# Patient Record
Sex: Female | Born: 1995
Health system: Southern US, Community
[De-identification: ages and names within clinical notes are randomized; demographics above are authoritative.]

## PROBLEM LIST (undated history)

## (undated) DIAGNOSIS — R479 Unspecified speech disturbances: Secondary | ICD-10-CM

## (undated) DIAGNOSIS — K5900 Constipation, unspecified: Secondary | ICD-10-CM

## (undated) DIAGNOSIS — F988 Other specified behavioral and emotional disorders with onset usually occurring in childhood and adolescence: Secondary | ICD-10-CM

## (undated) DIAGNOSIS — S82842A Displaced bimalleolar fracture of left lower leg, initial encounter for closed fracture: Secondary | ICD-10-CM

## (undated) DIAGNOSIS — S92902A Unspecified fracture of left foot, initial encounter for closed fracture: Secondary | ICD-10-CM

## (undated) DIAGNOSIS — G40219 Localization-related (focal) (partial) symptomatic epilepsy and epileptic syndromes with complex partial seizures, intractable, without status epilepticus: Secondary | ICD-10-CM

## (undated) DIAGNOSIS — G9349 Other encephalopathy: Secondary | ICD-10-CM

## (undated) HISTORY — DX: Displaced bimalleolar fracture of left lower leg, initial encounter for closed fracture: S82.842A

## (undated) HISTORY — DX: Other specified behavioral and emotional disorders with onset usually occurring in childhood and adolescence: F98.8

## (undated) HISTORY — DX: Localization-related (focal) (partial) symptomatic epilepsy and epileptic syndromes with complex partial seizures, intractable, without status epilepticus: G40.219

## (undated) HISTORY — DX: Other encephalopathy: G93.49

## (undated) HISTORY — DX: Unspecified fracture of left foot, initial encounter for closed fracture: S92.902A

---
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
## 2017-01-09 DIAGNOSIS — F79 Unspecified intellectual disabilities: Secondary | ICD-10-CM | POA: Diagnosis not present

## 2017-01-09 DIAGNOSIS — N3946 Mixed incontinence: Secondary | ICD-10-CM | POA: Diagnosis not present

## 2017-01-16 ENCOUNTER — Other Ambulatory Visit: Payer: Self-pay | Admitting: Family Medicine

## 2017-02-07 DIAGNOSIS — F79 Unspecified intellectual disabilities: Secondary | ICD-10-CM | POA: Diagnosis not present

## 2017-02-07 DIAGNOSIS — N3946 Mixed incontinence: Secondary | ICD-10-CM | POA: Diagnosis not present

## 2017-03-09 DIAGNOSIS — F79 Unspecified intellectual disabilities: Secondary | ICD-10-CM | POA: Diagnosis not present

## 2017-03-09 DIAGNOSIS — N3946 Mixed incontinence: Secondary | ICD-10-CM | POA: Diagnosis not present

## 2017-03-22 ENCOUNTER — Other Ambulatory Visit: Payer: Self-pay | Admitting: Family Medicine

## 2017-04-03 DIAGNOSIS — G40219 Localization-related (focal) (partial) symptomatic epilepsy and epileptic syndromes with complex partial seizures, intractable, without status epilepticus: Secondary | ICD-10-CM | POA: Diagnosis not present

## 2017-04-06 ENCOUNTER — Other Ambulatory Visit: Payer: BLUE CROSS/BLUE SHIELD

## 2017-04-09 DIAGNOSIS — F79 Unspecified intellectual disabilities: Secondary | ICD-10-CM | POA: Diagnosis not present

## 2017-04-09 DIAGNOSIS — N3946 Mixed incontinence: Secondary | ICD-10-CM | POA: Diagnosis not present

## 2017-04-13 ENCOUNTER — Other Ambulatory Visit: Payer: BLUE CROSS/BLUE SHIELD

## 2017-04-13 DIAGNOSIS — G40219 Localization-related (focal) (partial) symptomatic epilepsy and epileptic syndromes with complex partial seizures, intractable, without status epilepticus: Secondary | ICD-10-CM | POA: Diagnosis not present

## 2017-04-13 DIAGNOSIS — Z79899 Other long term (current) drug therapy: Secondary | ICD-10-CM | POA: Diagnosis not present

## 2017-04-13 DIAGNOSIS — G40209 Localization-related (focal) (partial) symptomatic epilepsy and epileptic syndromes with complex partial seizures, not intractable, without status epilepticus: Secondary | ICD-10-CM

## 2017-04-16 ENCOUNTER — Telehealth: Payer: Self-pay | Admitting: Family Medicine

## 2017-04-16 NOTE — Telephone Encounter (Signed)
These were drawn for her neurologist Dr. Claudie LeachLihn Ngo know that her total carbamazepine level was slightly hight at 12.9. We are still waiting on the free carbamazepine and lamotrigine to come back. Her CBC was normal. I will fax a copy of those labs when they come back. Thanks!

## 2017-04-16 NOTE — Telephone Encounter (Signed)
-----   Message from Particia Nearingachel Elizabeth Lane, [place] sent at 04/16/2017  9:31 AM EDT -----   ----- Message ----- From: Nell RangeInterface, [facility] Lab Results In Sent: 04/14/2017   7:41 AM To: Particia Nearingachel Elizabeth Lane, PA-C

## 2017-04-16 NOTE — Telephone Encounter (Signed)
Noted. Will send full results when they get back.

## 2017-04-16 NOTE — Telephone Encounter (Signed)
Called neuro, the NP is out of the office today, so I had to leave a detailed message on the administrative assistants machine letting her know the results.

## 2017-04-18 LAB — CBC WITH DIFFERENTIAL/PLATELET
Basophils Absolute: 0 10*3/uL (ref 0.0–0.2)
Basos: 0 %
EOS (ABSOLUTE): 0.1 10*3/uL (ref 0.0–0.4)
EOS: 2 %
HEMATOCRIT: 38.3 % (ref 34.0–46.6)
HEMOGLOBIN: 13.3 g/dL (ref 11.1–15.9)
Immature Grans (Abs): 0 10*3/uL (ref 0.0–0.1)
Immature Granulocytes: 0 %
LYMPHS ABS: 2.6 10*3/uL (ref 0.7–3.1)
Lymphs: 41 %
MCH: 30.7 pg (ref 26.6–33.0)
MCHC: 34.7 g/dL (ref 31.5–35.7)
MCV: 89 fL (ref 79–97)
Monocytes Absolute: 0.3 10*3/uL (ref 0.1–0.9)
Monocytes: 5 %
NEUTROS ABS: 3.3 10*3/uL (ref 1.4–7.0)
Neutrophils: 52 %
Platelets: 329 10*3/uL (ref 150–379)
RBC: 4.33 x10E6/uL (ref 3.77–5.28)
RDW: 13.5 % (ref 12.3–15.4)
WBC: 6.3 10*3/uL (ref 3.4–10.8)

## 2017-04-18 LAB — LAMOTRIGINE LEVEL: Lamotrigine Lvl: 12.1 ug/mL (ref 2.0–20.0)

## 2017-04-18 LAB — CARBAMAZEPINE, FREE AND TOTAL
Carbamazepine, Free: 2.4 ug/mL (ref 0.6–4.2)
Carbamazepine, Total: 12.9 ug/mL (ref 4.0–12.0)

## 2017-04-18 NOTE — Telephone Encounter (Addendum)
Labs [facility] and [facility] to [facility]

## 2017-05-04 ENCOUNTER — Other Ambulatory Visit: Payer: Self-pay | Admitting: Family Medicine

## 2017-05-09 DIAGNOSIS — F79 Unspecified intellectual disabilities: Secondary | ICD-10-CM | POA: Diagnosis not present

## 2017-05-09 DIAGNOSIS — N3946 Mixed incontinence: Secondary | ICD-10-CM | POA: Diagnosis not present

## 2017-06-12 ENCOUNTER — Ambulatory Visit
Admission: RE | Admit: 2017-06-12 | Discharge: 2017-06-12 | Disposition: A | Discharge status: Home / Self Care | Payer: BLUE CROSS/BLUE SHIELD | Source: Ambulatory Visit | Attending: Family Medicine | Admitting: Family Medicine

## 2017-06-12 ENCOUNTER — Encounter: Payer: Self-pay | Admitting: Family Medicine

## 2017-06-12 ENCOUNTER — Telehealth: Payer: Self-pay

## 2017-06-12 ENCOUNTER — Ambulatory Visit (INDEPENDENT_AMBULATORY_CARE_PROVIDER_SITE_OTHER): Payer: BLUE CROSS/BLUE SHIELD | Admitting: Family Medicine

## 2017-06-12 VITALS — BP 102/74 | HR 122

## 2017-06-12 DIAGNOSIS — M25572 Pain in left ankle and joints of left foot: Secondary | ICD-10-CM | POA: Diagnosis not present

## 2017-06-12 DIAGNOSIS — G40219 Localization-related (focal) (partial) symptomatic epilepsy and epileptic syndromes with complex partial seizures, intractable, without status epilepticus: Secondary | ICD-10-CM | POA: Diagnosis not present

## 2017-06-12 DIAGNOSIS — S82852A Displaced trimalleolar fracture of left lower leg, initial encounter for closed fracture: Secondary | ICD-10-CM | POA: Insufficient documentation

## 2017-06-12 DIAGNOSIS — W19XXXA Unspecified fall, initial encounter: Secondary | ICD-10-CM | POA: Insufficient documentation

## 2017-06-12 DIAGNOSIS — N3946 Mixed incontinence: Secondary | ICD-10-CM | POA: Diagnosis not present

## 2017-06-12 DIAGNOSIS — F79 Unspecified intellectual disabilities: Secondary | ICD-10-CM | POA: Diagnosis not present

## 2017-06-12 NOTE — Telephone Encounter (Signed)
Advised group home that referral is scheduled for ankle fracture today at [facility] with Dr. Clent RidgesKransinksi at 2pm. Also reminded them to call her neurologist and let them know she had another seizure and to have a follow up appt for that.

## 2017-06-12 NOTE — Progress Notes (Signed)
   BP 102/74   Pulse (!) 122   SpO2 96%    Subjective:    Patient ID: Marcia BrashLauren Livolsi, female    DOB: October 09, 1996, 21 y.o.   MRN: [id number]  HPI: Marcia BrashLauren Escalera is a 21 y.o. female  Chief Complaint  Patient presents with  . Foot Injury    she fell Saturday and injured her left foot, swollen, painful, she won't put pressure on it.    Patient presents with caregivers today (who provide the entire history) as they noticed her left foot was significantly swollen, bruised, and painful per pt. They state she refuses to bear weight on it. Suspected to have come from an unwitnessed seizure Saturday. Has not been given tylenol, denies use of ice, elevation, walking supports, compression at this time. Caregivers state this issue was not noted until this morning.   Relevant past medical, surgical, family and social history reviewed and updated as indicated. Interim medical history since our last visit reviewed. Allergies and medications reviewed and updated.  Review of Systems  Constitutional: Negative.   Respiratory: Negative.   Cardiovascular: Negative.   Gastrointestinal: Negative.   Musculoskeletal: Positive for arthralgias and joint swelling.  Neurological: Negative.   Psychiatric/Behavioral: Negative.    Per HPI unless specifically indicated above     Objective:    BP 102/74   Pulse (!) 122   SpO2 96%   Wt Readings from Last 3 Encounters:  12/13/16 166 lb (75.3 kg) (91 %, Z= 1.33)*  12/01/16 164 lb (74.4 kg) (90 %, Z= 1.28)*  11/15/16 164 lb (74.4 kg) (90 %, Z= 1.29)*   * Growth percentiles are based on CDC 2-20 Years data.    Physical Exam  Constitutional: She appears well-developed and well-nourished. No distress.  HENT:  Head: Atraumatic.  Eyes: Pupils are equal, round, and reactive to light. Conjunctivae are normal.  Neck: Normal range of motion. Neck supple.  Cardiovascular: Normal rate.   Pulmonary/Chest: Effort normal. No respiratory distress.  Musculoskeletal:  She exhibits edema (significant edema left ankle down into entire foot) and tenderness (left ankle).  Limited exam today given pt discomfort at left ankle, foot  Neurological: She is alert.  Skin: Skin is warm and dry.  Significant bruising at left ankle  Psychiatric: She has a normal mood and affect. Her behavior is normal.  Nursing note and vitals reviewed.     Assessment & Plan:   Problem List Items Addressed This Visit      Nervous and Auditory   Partial epilepsy with impairment of consciousness, intractable (HCC)    Continue current regimen, f/u as soon as possible with Neurologist to discuss continued seizure episodes       Other Visit Diagnoses    Acute left ankle pain    -  Primary   Will get left ankle x-ray, suspect fx given presentation and non-weight bearing status. RICE protocol until results come in   Relevant Orders   DG Ankle Complete Left (Completed)       Follow up plan: Return if symptoms worsen or fail to improve.

## 2017-06-13 DIAGNOSIS — S82855A Nondisplaced trimalleolar fracture of left lower leg, initial encounter for closed fracture: Secondary | ICD-10-CM | POA: Diagnosis not present

## 2017-06-14 NOTE — Assessment & Plan Note (Signed)
Continue current regimen, f/u as soon as possible with Neurologist to discuss continued seizure episodes

## 2017-06-14 NOTE — Patient Instructions (Signed)
Follow up with Neurology.

## 2017-06-15 DIAGNOSIS — S8292XA Unspecified fracture of left lower leg, initial encounter for closed fracture: Secondary | ICD-10-CM | POA: Diagnosis not present

## 2017-06-28 DIAGNOSIS — S82855D Nondisplaced trimalleolar fracture of left lower leg, subsequent encounter for closed fracture with routine healing: Secondary | ICD-10-CM | POA: Diagnosis not present

## 2017-07-10 DIAGNOSIS — N3946 Mixed incontinence: Secondary | ICD-10-CM | POA: Diagnosis not present

## 2017-07-10 DIAGNOSIS — F79 Unspecified intellectual disabilities: Secondary | ICD-10-CM | POA: Diagnosis not present

## 2017-07-26 DIAGNOSIS — S82855D Nondisplaced trimalleolar fracture of left lower leg, subsequent encounter for closed fracture with routine healing: Secondary | ICD-10-CM | POA: Diagnosis not present

## 2017-08-06 DIAGNOSIS — M6281 Muscle weakness (generalized): Secondary | ICD-10-CM | POA: Diagnosis not present

## 2017-08-06 DIAGNOSIS — R262 Difficulty in walking, not elsewhere classified: Secondary | ICD-10-CM | POA: Diagnosis not present

## 2017-08-08 DIAGNOSIS — R262 Difficulty in walking, not elsewhere classified: Secondary | ICD-10-CM | POA: Diagnosis not present

## 2017-08-08 DIAGNOSIS — M6281 Muscle weakness (generalized): Secondary | ICD-10-CM | POA: Diagnosis not present

## 2017-08-10 DIAGNOSIS — F79 Unspecified intellectual disabilities: Secondary | ICD-10-CM | POA: Diagnosis not present

## 2017-08-10 DIAGNOSIS — N3946 Mixed incontinence: Secondary | ICD-10-CM | POA: Diagnosis not present

## 2017-08-15 ENCOUNTER — Encounter: Payer: Self-pay | Admitting: Family Medicine

## 2017-08-15 ENCOUNTER — Ambulatory Visit: Admission: RE | Admit: 2017-08-15 | Payer: BLUE CROSS/BLUE SHIELD | Source: Ambulatory Visit | Admitting: *Deleted

## 2017-08-15 ENCOUNTER — Ambulatory Visit
Admission: RE | Admit: 2017-08-15 | Discharge: 2017-08-15 | Disposition: A | Discharge status: Home / Self Care | Payer: BLUE CROSS/BLUE SHIELD | Source: Ambulatory Visit | Attending: Family Medicine | Admitting: Family Medicine

## 2017-08-15 ENCOUNTER — Ambulatory Visit (INDEPENDENT_AMBULATORY_CARE_PROVIDER_SITE_OTHER): Payer: BLUE CROSS/BLUE SHIELD | Admitting: Family Medicine

## 2017-08-15 VITALS — BP 93/65 | HR 97 | Temp 99.2°F

## 2017-08-15 DIAGNOSIS — M419 Scoliosis, unspecified: Secondary | ICD-10-CM | POA: Diagnosis not present

## 2017-08-15 DIAGNOSIS — M545 Low back pain, unspecified: Secondary | ICD-10-CM

## 2017-08-15 DIAGNOSIS — R42 Dizziness and giddiness: Secondary | ICD-10-CM

## 2017-08-15 LAB — UA/M W/RFLX CULTURE, ROUTINE
BILIRUBIN UA: NEGATIVE
GLUCOSE, UA: NEGATIVE
KETONES UA: NEGATIVE
Leukocytes, UA: NEGATIVE
NITRITE UA: NEGATIVE
Protein, UA: NEGATIVE
RBC UA: NEGATIVE
SPEC GRAV UA: 1.02 (ref 1.005–1.030)
UUROB: 0.2 mg/dL (ref 0.2–1.0)
pH, UA: 6.5 (ref 5.0–7.5)

## 2017-08-15 NOTE — Progress Notes (Signed)
BP 93/65 (BP Location: Left Arm, Patient Position: Sitting, Cuff Size: Normal)   Pulse 97   Temp 99.2 F (37.3 C) (Tympanic)   SpO2 97%    Subjective:    Patient ID: Angelica Stevenson, female    DOB: 01-Mar-1996, 21 y.o.   MRN: [id number]  HPI: Angelica Stevenson is a 21 y.o. female  Chief Complaint  Patient presents with  . Back Pain    Pt reports back pain and dizziness. Caregiver states she could have had a seizure last night.  . Dizziness   Genita presents today with a couple hour history of low back pain. Her caregivers think that she may have had an unwittnessed seaizure last night, but they aren't sure. She started complaining of low back pain at breakfast. No known injuries. Pain is aching and sore in and in the lumbar region. They haven't tried anything for it. Nothing seems to make it worse. No radiation. She is otherwise feeling well. Her urine seems to be fine. She notes that she has also been having some dizziness. She is otherwise feeling well with no other concerns. She does have a history of a fracture of her foot with minimal trauma.   Relevant past medical, surgical, family and social history reviewed and updated as indicated. Interim medical history since our last visit reviewed. Allergies and medications reviewed and updated.  Review of Systems  Constitutional: Negative.   Respiratory: Negative.   Cardiovascular: Negative.   Musculoskeletal: Positive for back pain and myalgias. Negative for arthralgias, gait problem, joint swelling, neck pain and neck stiffness.  Neurological: Positive for dizziness. Negative for tremors, seizures, syncope, facial asymmetry, speech difficulty, weakness, light-headedness, numbness and headaches.  Psychiatric/Behavioral: Negative.     Per HPI unless specifically indicated above     Objective:    BP 93/65 (BP Location: Left Arm, Patient Position: Sitting, Cuff Size: Normal)   Pulse 97   Temp 99.2 F (37.3 C) (Tympanic)   SpO2  97%   Wt Readings from Last 3 Encounters:  12/13/16 166 lb (75.3 kg) (91 %, Z= 1.33)*  12/01/16 164 lb (74.4 kg) (90 %, Z= 1.28)*  11/15/16 164 lb (74.4 kg) (90 %, Z= 1.29)*   * Growth percentiles are based on CDC (Girls, 2-20 Years) data.    Physical Exam  Constitutional: She is oriented to person, place, and time. She appears well-developed and well-nourished. No distress.  HENT:  Head: Normocephalic and atraumatic.  Right Ear: Hearing normal.  Left Ear: Hearing normal.  Nose: Nose normal.  Eyes: Conjunctivae and lids are normal. Right eye exhibits no discharge. Left eye exhibits no discharge. No scleral icterus.  Cardiovascular: Normal rate, regular rhythm, normal heart sounds and intact distal pulses. Exam reveals no gallop and no friction rub.  No murmur heard. Pulmonary/Chest: Effort normal and breath sounds normal. No respiratory distress. She has no wheezes. She has no rales. She exhibits no tenderness.  Neurological: She is alert and oriented to person, place, and time.  Skin: Skin is warm and intact. No rash noted. She is not diaphoretic. No erythema. No pallor.  Psychiatric: She has a normal mood and affect. Her speech is normal and behavior is normal. Judgment and thought content normal. Cognition and memory are normal.  Nursing note and vitals reviewed. Back Exam:    Inspection:  Normal spinal curvature.  No deformity, ecchymosis, erythema, or lesions     Palpation:     Midline spinal tenderness: yes lumbar  Paralumbar tenderness: yes bilateral     Parathoracic tenderness: no      Buttocks tenderness: no     Range of Motion:      Flexion: Normal     Extension:Normal     Lateral bending:Normal    Rotation:Normal    Neuro Exam:Lower extremity DTRs normal & symmetric.  Strength and sensation intact.    Special Tests:      Straight leg raise:negative  Results for orders placed or performed in visit on 04/13/17  Lamotrigine level  Result Value Ref Range    Lamotrigine Lvl 12.1 2.0 - 20.0 ug/mL  Carbamazepine, Free and Total  Result Value Ref Range   Carbamazepine, Total 12.9 (HH) 4.0 - 12.0 ug/mL   Carbamazepine, Free 2.4 0.6 - 4.2 ug/mL  CBC with Differential/Platelet  Result Value Ref Range   WBC 6.3 3.4 - 10.8 x10E3/uL   RBC 4.33 3.77 - 5.28 x10E6/uL   Hemoglobin 13.3 11.1 - 15.9 g/dL   Hematocrit 16.138.3 09.634.0 - 46.6 %   MCV 89 79 - 97 fL   MCH 30.7 26.6 - 33.0 pg   MCHC 34.7 31.5 - 35.7 g/dL   RDW 04.513.5 40.912.3 - 81.115.4 %   Platelets 329 150 - 379 x10E3/uL   Neutrophils 52 Not Estab. %   Lymphs 41 Not Estab. %   Monocytes 5 Not Estab. %   Eos 2 Not Estab. %   Basos 0 Not Estab. %   Neutrophils Absolute 3.3 1.4 - 7.0 x10E3/uL   Lymphocytes Absolute 2.6 0.7 - 3.1 x10E3/uL   Monocytes Absolute 0.3 0.1 - 0.9 x10E3/uL   EOS (ABSOLUTE) 0.1 0.0 - 0.4 x10E3/uL   Basophils Absolute 0.0 0.0 - 0.2 x10E3/uL   Immature Granulocytes 0 Not Estab. %   Immature Grans (Abs) 0.0 0.0 - 0.1 x10E3/uL      Assessment & Plan:   Problem List Items Addressed This Visit    None    Visit Diagnoses    Acute bilateral low back pain without sciatica    -  Primary   Likely muscle spasm. Will obtain x-ray given history of fracture with minimal trauma. Await results. Treat with PRN tylenol and heat. Let us know if not better.   Relevant Orders   UA/M w/rflx Culture, Routine   DG Lumbar Spine Complete   Dizziness       Not with siting to standing. Possible post-ictal. If not better in a day or 2, let us know.        Follow up plan: Return Pending results. .Marland Kitchen

## 2017-08-16 ENCOUNTER — Telehealth: Payer: Self-pay | Admitting: Family Medicine

## 2017-08-16 NOTE — Telephone Encounter (Signed)
Please let them know that her x-ray came back normal. Thanks!

## 2017-08-16 NOTE — Telephone Encounter (Signed)
Called and left a voicemail on home number letting parents know that the xray was normal.

## 2017-08-20 DIAGNOSIS — M6281 Muscle weakness (generalized): Secondary | ICD-10-CM | POA: Diagnosis not present

## 2017-08-20 DIAGNOSIS — R262 Difficulty in walking, not elsewhere classified: Secondary | ICD-10-CM | POA: Diagnosis not present

## 2017-08-27 DIAGNOSIS — M6281 Muscle weakness (generalized): Secondary | ICD-10-CM | POA: Diagnosis not present

## 2017-08-27 DIAGNOSIS — R262 Difficulty in walking, not elsewhere classified: Secondary | ICD-10-CM | POA: Diagnosis not present

## 2017-08-27 DIAGNOSIS — S82855D Nondisplaced trimalleolar fracture of left lower leg, subsequent encounter for closed fracture with routine healing: Secondary | ICD-10-CM | POA: Diagnosis not present

## 2017-09-03 DIAGNOSIS — M6281 Muscle weakness (generalized): Secondary | ICD-10-CM | POA: Diagnosis not present

## 2017-09-03 DIAGNOSIS — R262 Difficulty in walking, not elsewhere classified: Secondary | ICD-10-CM | POA: Diagnosis not present

## 2017-09-10 DIAGNOSIS — S82855D Nondisplaced trimalleolar fracture of left lower leg, subsequent encounter for closed fracture with routine healing: Secondary | ICD-10-CM | POA: Diagnosis not present

## 2017-09-11 DIAGNOSIS — F79 Unspecified intellectual disabilities: Secondary | ICD-10-CM | POA: Diagnosis not present

## 2017-09-11 DIAGNOSIS — N3946 Mixed incontinence: Secondary | ICD-10-CM | POA: Diagnosis not present

## 2017-09-20 DIAGNOSIS — S82842A Displaced bimalleolar fracture of left lower leg, initial encounter for closed fracture: Secondary | ICD-10-CM | POA: Diagnosis not present

## 2017-09-21 DIAGNOSIS — M6281 Muscle weakness (generalized): Secondary | ICD-10-CM | POA: Diagnosis not present

## 2017-09-21 DIAGNOSIS — R262 Difficulty in walking, not elsewhere classified: Secondary | ICD-10-CM | POA: Diagnosis not present

## 2017-10-10 DIAGNOSIS — F79 Unspecified intellectual disabilities: Secondary | ICD-10-CM | POA: Diagnosis not present

## 2017-10-10 DIAGNOSIS — N3946 Mixed incontinence: Secondary | ICD-10-CM | POA: Diagnosis not present

## 2017-10-11 DIAGNOSIS — S82842A Displaced bimalleolar fracture of left lower leg, initial encounter for closed fracture: Secondary | ICD-10-CM | POA: Diagnosis not present

## 2017-10-15 ENCOUNTER — Other Ambulatory Visit: Payer: Self-pay | Admitting: Orthopedic Surgery

## 2017-10-16 ENCOUNTER — Encounter
Admission: RE | Admit: 2017-10-16 | Discharge: 2017-10-16 | Disposition: A | Discharge status: Home / Self Care | Payer: BLUE CROSS/BLUE SHIELD | Source: Ambulatory Visit | Attending: Orthopedic Surgery | Admitting: Orthopedic Surgery

## 2017-10-16 ENCOUNTER — Telehealth: Payer: Self-pay | Admitting: Family Medicine

## 2017-10-16 ENCOUNTER — Other Ambulatory Visit: Payer: Self-pay

## 2017-10-16 ENCOUNTER — Encounter: Payer: Self-pay | Admitting: Family Medicine

## 2017-10-16 ENCOUNTER — Ambulatory Visit (INDEPENDENT_AMBULATORY_CARE_PROVIDER_SITE_OTHER): Payer: BLUE CROSS/BLUE SHIELD | Admitting: Family Medicine

## 2017-10-16 VITALS — BP 106/73 | HR 100 | Temp 97.9°F

## 2017-10-16 DIAGNOSIS — S82842A Displaced bimalleolar fracture of left lower leg, initial encounter for closed fracture: Secondary | ICD-10-CM | POA: Diagnosis not present

## 2017-10-16 DIAGNOSIS — Z79899 Other long term (current) drug therapy: Secondary | ICD-10-CM | POA: Diagnosis not present

## 2017-10-16 DIAGNOSIS — G40219 Localization-related (focal) (partial) symptomatic epilepsy and epileptic syndromes with complex partial seizures, intractable, without status epilepticus: Secondary | ICD-10-CM

## 2017-10-16 DIAGNOSIS — Z887 Allergy status to serum and vaccine status: Secondary | ICD-10-CM | POA: Diagnosis not present

## 2017-10-16 DIAGNOSIS — G9349 Other encephalopathy: Secondary | ICD-10-CM | POA: Diagnosis not present

## 2017-10-16 DIAGNOSIS — Z01818 Encounter for other preprocedural examination: Secondary | ICD-10-CM | POA: Diagnosis not present

## 2017-10-16 DIAGNOSIS — S92902D Unspecified fracture of left foot, subsequent encounter for fracture with routine healing: Secondary | ICD-10-CM

## 2017-10-16 DIAGNOSIS — K5900 Constipation, unspecified: Secondary | ICD-10-CM | POA: Diagnosis not present

## 2017-10-16 DIAGNOSIS — F988 Other specified behavioral and emotional disorders with onset usually occurring in childhood and adolescence: Secondary | ICD-10-CM | POA: Diagnosis not present

## 2017-10-16 DIAGNOSIS — Y939 Activity, unspecified: Secondary | ICD-10-CM | POA: Diagnosis not present

## 2017-10-16 DIAGNOSIS — G40802 Other epilepsy, not intractable, without status epilepticus: Secondary | ICD-10-CM | POA: Diagnosis not present

## 2017-10-16 DIAGNOSIS — W19XXXA Unspecified fall, initial encounter: Secondary | ICD-10-CM | POA: Diagnosis not present

## 2017-10-16 DIAGNOSIS — R479 Unspecified speech disturbances: Secondary | ICD-10-CM | POA: Diagnosis not present

## 2017-10-16 HISTORY — DX: Unspecified speech disturbances: R47.9

## 2017-10-16 HISTORY — DX: Constipation, unspecified: K59.00

## 2017-10-16 NOTE — Assessment & Plan Note (Signed)
To have surgery on 10/18/17.

## 2017-10-16 NOTE — Telephone Encounter (Signed)
Copied from CRM [phone]. Topic: Inquiry >> Oct 16, 2017  9:33 AM Viviann SpareWhite, Selina wrote: Reason for CRM: Dennard NipCheri from [facility] [phone] called to check on the status of the surgery clearance form that was faxed over on 10/11/17 to the attention of Dr. Laural BenesJohnson. The patient is having surgery on 10/18/17 for a left ankle fracture. Dennard NipCheri is re-faxing the form today.

## 2017-10-16 NOTE — Progress Notes (Signed)
BP 106/73 (BP Location: Left Arm, Patient Position: Sitting, Cuff Size: Normal)   Pulse 100   Temp 97.9 F (36.6 C)   SpO2 98%    Subjective:    Patient ID: Angelica Stevenson, female    DOB: 1996-03-18, 22 y.o.   MRN: [id number]  HPI: Angelica Stevenson is a 22 y.o. female  Chief Complaint  Patient presents with  . Surgical Clearance  . FL2   Patient presents today for re-upping her FL2 form for her group home. She is doing well with no concerns or complaints today. She also needs surgical clearance for fixing a L bi-mallleolar ankle fracture. She is scheduled for a open reduction on Thursday 10/18/17. Caregiver was not able to give history. Call made to patient's father in the room. Patient's father states that Angelica Stevenson has never had surgery before. She has had minor sedatives for CTs/MRIs, but has never been fully anesthetized before. She denies any SOB with walking, no history of asthma. No chest pain. No abdominal pain. Generally feeling well, but not able to walk well currently due to her fractures which is why they are pursuing her surgery. Father denies that she has any family history of issues with anesthesia. No family history of post-op nausea or vomiting, no history of malignant hyperthermia. No one has ever had any problems with anesthesia at all. She has a history of epilepsy and both father and caregiver are aware that we can clear Angelica Stevenson pending her results from an internal/family medicine stand point but will not clear her neurologically or make recommendations regarding her seizure medicines. Those will need to come from neurology. They are aware. No other concerns or complaints at this time.    Active Ambulatory Problems    Diagnosis Date Noted  . Partial epilepsy with impairment of consciousness, intractable (HCC)   . Attention deficit disorder (ADD) without hyperactivity   . Static encephalopathy   . Closed fracture of bone of left foot 09/08/2016  . Intermittent explosive  disorder 10/13/2016  . Speech delay 10/13/2016  . Encopresis 10/13/2016  . Dysmenorrhea treated with oral contraceptive 10/13/2016   Resolved Ambulatory Problems    Diagnosis Date Noted  . No Resolved Ambulatory Problems   Past Medical History:  Diagnosis Date  . Attention deficit disorder (ADD) without hyperactivity   . Partial epilepsy with impairment of consciousness, intractable (HCC)   . Static encephalopathy    History reviewed. No pertinent surgical history.  Outpatient Encounter Medications as of 10/16/2017  Medication Sig Note  . acetaminophen (TYLENOL) 160 MG/5ML liquid Take 20 mLs (640 mg total) by mouth every 6 (six) hours as needed for fever or pain. (Patient taking differently: Take 640 mg by mouth every 4 (four) hours as needed for fever or pain (for pain/temp/cold symptoms.). )   . albuterol (PROVENTIL HFA;VENTOLIN HFA) 108 (90 Base) MCG/ACT inhaler Inhale 2 puffs into the lungs every 6 (six) hours as needed for wheezing or shortness of breath.   . carbamazepine (CARBATROL) 200 MG 12 hr capsule Take 200 mg by mouth 2 (two) times daily. (0700 & 1900) 10/12/2017: Total dose=500 mg twice daily  . carbamazepine (CARBATROL) 300 MG 12 hr capsule Take 300 mg by mouth 2 (two) times daily. (0700 &1900) 10/12/2017: Total dose=500 mg twice daily  . guaifenesin (ROBITUSSIN) 100 MG/5ML syrup Take 100 mg by mouth every 4 (four) hours as needed for cough.    . lactulose (CHRONULAC) 10 GM/15ML solution Take 13.3333 g by mouth every evening. (1900)Take 20  mls by mouth every evening   . lamoTRIgine (LAMICTAL) 150 MG tablet Take 300 mg by mouth 2 (two) times daily. (0700 &1900)   . LO LOESTRIN FE 1 MG-10 MCG / 10 MCG tablet TAKE ONE TABLET BY MOUTH EVERY DAY (Patient taking differently: TAKE ONE TABLET BY MOUTH EVERY DAY AT 0700.)   . LORazepam (ATIVAN) 1 MG tablet Take 1.5 mg by mouth every 8 (eight) hours as needed for seizure (after any cluster of seizures, or prolonged seizure,).    .  Magnesium Oxide 250 MG TABS Take 250 mg by mouth daily at 6 (six) AM. (0600)   . midazolam (VERSED) 5 MG/ML injection Place 5 mg into the nose See admin instructions. Place 1 ml (5 mg) in each nostril or on each side mouth between cheek and gum for total of 10 mg for seizure > 5 minutes   . Multiple Vitamin (THEREMS) TABS TAKE ONE TABLET BY MOUTH EVERY EVENING (Patient taking differently: TAKE ONE TABLET BY MOUTH EVERY EVENING AT 1900)   . omega-3 acid ethyl esters (LOVAZA) 1 g capsule Take 1 g by mouth daily. (0700)   . risperiDONE (RISPERDAL) 1 MG tablet Take 1 mg by mouth 2 (two) times daily. (0700 &1900)   . STOOL SOFTENER 100 MG capsule TAKE ONE CAPSULE BY MOUTH EVERY MORNING (Patient taking differently: TAKE ONE CAPSULE BY MOUTH EVERY MORNING AT 0700.)   . [DISCONTINUED] Calcium 200 MG TABS TAKE ONE TABLET BY MOUTH EACH DAY. (Patient taking differently: TAKE ONE TABLET BY MOUTH DAILY AT 0700.)    No facility-administered encounter medications on file as of 10/16/2017.    Allergies  Allergen Reactions  . Influenza Vaccines Other (See Comments)    seizures    Social History   Socioeconomic History  . Marital status: Single    Spouse name: Not on file  . Number of children: Not on file  . Years of education: Not on file  . Highest education level: Not on file  Social Needs  . Financial resource strain: Not on file  . Food insecurity - worry: Not on file  . Food insecurity - inability: Not on file  . Transportation needs - medical: Not on file  . Transportation needs - non-medical: Not on file  Occupational History  . Not on file  Tobacco Use  . Smoking status: Never Smoker  . Smokeless tobacco: Never Used  Substance and Sexual Activity  . Alcohol use: No  . Drug use: No  . Sexual activity: Not on file  Other Topics Concern  . Not on file  Social History Narrative  . Not on file    History reviewed. No pertinent family history.  Review of Systems  Constitutional:  Negative.   Cardiovascular: Negative.   Gastrointestinal: Negative.   Genitourinary: Negative.   Musculoskeletal: Positive for arthralgias and gait problem. Negative for back pain, joint swelling, myalgias, neck pain and neck stiffness.  Skin: Negative.   Neurological: Positive for seizures. Negative for dizziness, tremors, syncope, facial asymmetry, speech difficulty, weakness, light-headedness, numbness and headaches.  Psychiatric/Behavioral: Negative.     Per HPI unless specifically indicated above     Objective:    BP 106/73 (BP Location: Left Arm, Patient Position: Sitting, Cuff Size: Normal)   Pulse 100   Temp 97.9 F (36.6 C)   SpO2 98%   Wt Readings from Last 3 Encounters:  12/13/16 166 lb (75.3 kg) (91 %, Z= 1.33)*  12/01/16 164 lb (74.4 kg) (90 %, Z=  1.28)*  11/15/16 164 lb (74.4 kg) (90 %, Z= 1.29)*   * Growth percentiles are based on CDC (Girls, 2-20 Years) data.    Physical Exam  Constitutional: She is oriented to person, place, and time. She appears well-developed and well-nourished. No distress.  HENT:  Head: Normocephalic and atraumatic.  Right Ear: Hearing normal.  Left Ear: Hearing normal.  Nose: Nose normal.  Eyes: Conjunctivae and lids are normal. Right eye exhibits no discharge. Left eye exhibits no discharge. No scleral icterus.  Cardiovascular: Normal rate, regular rhythm, normal heart sounds and intact distal pulses. Exam reveals no gallop and no friction rub.  No murmur heard. Pulmonary/Chest: Effort normal and breath sounds normal. No respiratory distress. She has no wheezes. She has no rales. She exhibits no tenderness.  Abdominal: Soft. Bowel sounds are normal. She exhibits no distension and no mass. There is no tenderness. There is no rebound and no guarding.  Musculoskeletal: Normal range of motion.  Neurological: She is alert and oriented to person, place, and time.  Skin: Skin is warm, dry and intact. No rash noted. She is not diaphoretic. No  erythema. No pallor.  Psychiatric: She has a normal mood and affect. Her speech is normal and behavior is normal. Cognition and memory are normal.  Nursing note and vitals reviewed.   Results for orders placed or performed in visit on 08/15/17  UA/M w/rflx Culture, Routine  Result Value Ref Range   Specific Gravity, UA 1.020 1.005 - 1.030   pH, UA 6.5 5.0 - 7.5   Color, UA Yellow Yellow   Appearance Ur Clear Clear   Leukocytes, UA Negative Negative   Protein, UA Negative Negative/Trace   Glucose, UA Negative Negative   Ketones, UA Negative Negative   RBC, UA Negative Negative   Bilirubin, UA Negative Negative   Urobilinogen, Ur 0.2 0.2 - 1.0 mg/dL   Nitrite, UA Negative Negative      Assessment & Plan:   Problem List Items Addressed This Visit      Nervous and Auditory   Partial epilepsy with impairment of consciousness, intractable (HCC)    FL2 form filled out today. Stable. Continue to follow with neurology.         Musculoskeletal and Integument   Closed fracture of bone of left foot    To have surgery on 10/18/17.        Other Visit Diagnoses    Preop exam for internal medicine    -  Primary   EKG normal today. Should be cleared from a family medicine perspective pending lab review. Not cleared from neurologic perspective- needs neuro clearance.    Relevant Orders   EKG 12-Lead (Completed)   CBC with Differential/Platelet   Comprehensive metabolic panel   INR/PT   HgB [id number]       Follow up plan: Return As scheduled.

## 2017-10-16 NOTE — Telephone Encounter (Signed)
Patient is being seen today, paperwork will be filled out and faxed when completed.

## 2017-10-16 NOTE — Patient Instructions (Signed)
Your procedure is scheduled on: Thurs. 10/18/17 Report to Day Surgery. To find out your arrival time please call [phone] between 1PM - 3PM on Wed. 10/17/17.  Remember: Instructions that are not followed completely may result in serious medical risk, up to and including death, or upon the discretion of your surgeon and anesthesiologist your surgery may need to be rescheduled.     _X__ 1. Do not eat food after midnight the night before your procedure.                 No gum chewing or hard candies. You may drink clear liquids up to 2 hours                 before you are scheduled to arrive for your surgery- DO not drink clear                 liquids within 2 hours of the start of your surgery.                 Clear Liquids include:  water, apple juice without pulp, clear carbohydrate                 drink such as Clearfast of Gartorade, Black Coffee or Tea (Do not add                 anything to coffee or tea).     __ 2.  No Alcohol for 24 hours before or after surgery.   __ 3.  Do Not Smoke or use e-cigarettes For 24 Hours Prior to Your Surgery.                 Do not use any chewable tobacco products for at least 6 hours prior to                 surgery.  ____  4.  Bring all medications with you on the day of surgery if instructed.   _x___  5.  Notify your doctor if there is any change in your medical condition      (cold, fever, infections).     Do not wear jewelry, make-up, hairpins, clips or nail polish. Do not wear lotions, powders, or perfumes. You may wear deodorant. Do not shave 48 hours prior to surgery. Men may shave face and neck. Do not bring valuables to the hospital.    [facility] any belongings or valuables.  Contacts, dentures or bridgework may not be worn into surgery. Leave your suitcase in the car. After surgery it may be brought to your room. For patients admitted to the hospital, discharge time is determined by  your treatment team.   Patients discharged the day of surgery will not be allowed to drive home.   Please read over the following fact sheets that you were given:    _x___ Take these medicines the morning of surgery with A SIP OF WATER:    1acetaminophen (TYLENOL) 160 MG/5ML liquid.  If needed  2. carbamazepine (CARBATROL) 200 MG 12 hr capsule  3. carbamazepine (CARBATROL) 300 MG 12 hr capsule  4.lamoTRIgine (LAMICTAL) 150 MG tablet  5.LO LOESTRIN FE 1 MG-10 MCG / 10 MCG tablet  6.risperiDONE (RISPERDAL) 1 MG tablet  ____ Fleet Enema (as directed)   __x__ Use  Sage wipes as directed  _x___ Use inhalers on the day of surgeryalbuterol (PROVENTIL HFA;VENTOLIN HFA) 108 (90 Base) MCG/ACT inhaler and bring with her to  hospital  ____ Stop metformin 2 days prior to surgery    ____ Take 1/2 of usual insulin dose the night before surgery. No insulin the morning          of surgery.   ____ Stop Coumadin/Plavix/aspirin on   ____ Stop Anti-inflammatories on    _x___ Stop supplements until after surgery.  omega-3 acid ethyl esters (LOVAZA) 1 g capsule hold until after surgery  ____ Bring C-Pap to the hospital.

## 2017-10-16 NOTE — Assessment & Plan Note (Signed)
FL2 form filled out today. Stable. Continue to follow with neurology.

## 2017-10-17 ENCOUNTER — Telehealth: Payer: Self-pay | Admitting: Family Medicine

## 2017-10-17 MED ORDER — CEFAZOLIN SODIUM-DEXTROSE 2-4 GM/100ML-% IV SOLN
2.0000 g | INTRAVENOUS | Status: AC
  Administered 2017-10-18: 2 g via INTRAVENOUS

## 2017-10-17 NOTE — Telephone Encounter (Signed)
She was supposed to have blood work done at the hospital yesterday. I don't see that she got her blood drawn. I need to have her blood work back to clear her for surgery- please find out what's going on with this.

## 2017-10-17 NOTE — Anesthesia Pain Management Evaluation Note (Signed)
NEURO CLEARANCE ON CHART WITH RECOMMENDATIONS.

## 2017-10-17 NOTE — Pre-Procedure Instructions (Signed)
NEURO CLEARANCE NOTE ON CHART

## 2017-10-17 NOTE — Telephone Encounter (Signed)
Tried calling, no answer. Said please enter in access code.  Will try again later.

## 2017-10-17 NOTE — Pre-Procedure Instructions (Signed)
[facility] [place] [facility] NOTING NEEDS NEURO CLEARANCE

## 2017-10-17 NOTE — Telephone Encounter (Signed)
Spoke with Roxie. She stated that she didn't know, she just came in and the other worker didn't know as well. Roxie said she would call someone else and give us a call back.   [facility] of [facility] call. Patient cannot have surgical clearance without these labs..Marland Kitchen

## 2017-10-18 ENCOUNTER — Ambulatory Visit: Payer: BLUE CROSS/BLUE SHIELD | Admitting: Certified Registered Nurse Anesthetist

## 2017-10-18 ENCOUNTER — Other Ambulatory Visit: Payer: Self-pay | Admitting: Family Medicine

## 2017-10-18 ENCOUNTER — Ambulatory Visit: Payer: BLUE CROSS/BLUE SHIELD

## 2017-10-18 ENCOUNTER — Ambulatory Visit: Payer: BLUE CROSS/BLUE SHIELD | Admitting: Family Medicine

## 2017-10-18 ENCOUNTER — Encounter
Admission: AD | Disposition: A | Discharge status: Home / Self Care | Payer: Self-pay | Source: Ambulatory Visit | Attending: Orthopedic Surgery

## 2017-10-18 ENCOUNTER — Other Ambulatory Visit: Payer: Self-pay

## 2017-10-18 ENCOUNTER — Observation Stay
Admission: AD | Admit: 2017-10-18 | Discharge: 2017-10-20 | DRG: 494 | Disposition: A | Discharge status: Home / Self Care | Payer: BLUE CROSS/BLUE SHIELD | Source: Ambulatory Visit | Attending: Orthopedic Surgery | Admitting: Orthopedic Surgery

## 2017-10-18 DIAGNOSIS — S82842A Displaced bimalleolar fracture of left lower leg, initial encounter for closed fracture: Secondary | ICD-10-CM

## 2017-10-18 DIAGNOSIS — R479 Unspecified speech disturbances: Secondary | ICD-10-CM | POA: Diagnosis not present

## 2017-10-18 DIAGNOSIS — F988 Other specified behavioral and emotional disorders with onset usually occurring in childhood and adolescence: Secondary | ICD-10-CM | POA: Diagnosis not present

## 2017-10-18 DIAGNOSIS — Z01818 Encounter for other preprocedural examination: Secondary | ICD-10-CM

## 2017-10-18 DIAGNOSIS — Z887 Allergy status to serum and vaccine status: Secondary | ICD-10-CM | POA: Insufficient documentation

## 2017-10-18 DIAGNOSIS — Z79899 Other long term (current) drug therapy: Secondary | ICD-10-CM | POA: Insufficient documentation

## 2017-10-18 DIAGNOSIS — G40802 Other epilepsy, not intractable, without status epilepticus: Secondary | ICD-10-CM | POA: Diagnosis not present

## 2017-10-18 DIAGNOSIS — R000 Tachycardia, unspecified: Secondary | ICD-10-CM | POA: Diagnosis not present

## 2017-10-18 DIAGNOSIS — W19XXXA Unspecified fall, initial encounter: Secondary | ICD-10-CM | POA: Insufficient documentation

## 2017-10-18 DIAGNOSIS — K5900 Constipation, unspecified: Secondary | ICD-10-CM | POA: Diagnosis not present

## 2017-10-18 DIAGNOSIS — G9349 Other encephalopathy: Secondary | ICD-10-CM | POA: Diagnosis not present

## 2017-10-18 DIAGNOSIS — Y939 Activity, unspecified: Secondary | ICD-10-CM | POA: Insufficient documentation

## 2017-10-18 DIAGNOSIS — S82843A Displaced bimalleolar fracture of unspecified lower leg, initial encounter for closed fracture: Secondary | ICD-10-CM | POA: Diagnosis not present

## 2017-10-18 HISTORY — PX: ORIF ANKLE FRACTURE: SHX5408

## 2017-10-18 HISTORY — DX: Displaced bimalleolar fracture of left lower leg, initial encounter for closed fracture: S82.842A

## 2017-10-18 LAB — POCT PREGNANCY, URINE: PREG TEST UR: NEGATIVE

## 2017-10-18 SURGERY — OPEN REDUCTION INTERNAL FIXATION (ORIF) ANKLE FRACTURE
Anesthesia: General | Site: Ankle | Laterality: Left | Wound class: Clean

## 2017-10-18 MED ORDER — CEFAZOLIN SODIUM-DEXTROSE 2-4 GM/100ML-% IV SOLN
INTRAVENOUS | Status: AC
  Filled 2017-10-18: qty 100

## 2017-10-18 MED ORDER — DEXAMETHASONE SODIUM PHOSPHATE 10 MG/ML IJ SOLN
INTRAMUSCULAR | Status: AC
  Filled 2017-10-18: qty 1

## 2017-10-18 MED ORDER — ACETAMINOPHEN 650 MG RE SUPP: 650.0000 mg | Freq: Four times a day (QID) | RECTAL | Status: DC | PRN

## 2017-10-18 MED ORDER — MAGNESIUM OXIDE 400 (241.3 MG) MG PO TABS
400.0000 mg | ORAL_TABLET | Freq: Every day | ORAL | Status: DC
  Administered 2017-10-19 – 2017-10-20 (×2): 400 mg via ORAL
  Filled 2017-10-18 (×2): qty 1

## 2017-10-18 MED ORDER — DEXMEDETOMIDINE HCL IN NACL 80 MCG/20ML IV SOLN
INTRAVENOUS | Status: AC
  Filled 2017-10-18: qty 20

## 2017-10-18 MED ORDER — NORETHIN-ETH ESTRAD-FE BIPHAS 1 MG-10 MCG / 10 MCG PO TABS: 1.0000 | ORAL_TABLET | Freq: Every day | ORAL | Status: DC

## 2017-10-18 MED ORDER — OXYCODONE HCL 5 MG/5ML PO SOLN: 5.0000 mg | Freq: Once | ORAL | Status: DC | PRN

## 2017-10-18 MED ORDER — FENTANYL CITRATE (PF) 250 MCG/5ML IJ SOLN
INTRAMUSCULAR | Status: AC
  Filled 2017-10-18: qty 5

## 2017-10-18 MED ORDER — RISPERIDONE 1 MG PO TABS
1.0000 mg | ORAL_TABLET | Freq: Two times a day (BID) | ORAL | Status: DC
  Administered 2017-10-18 – 2017-10-20 (×4): 1 mg via ORAL
  Filled 2017-10-18 (×6): qty 1

## 2017-10-18 MED ORDER — PROMETHAZINE HCL 25 MG/ML IJ SOLN: 6.2500 mg | INTRAMUSCULAR | Status: DC | PRN

## 2017-10-18 MED ORDER — ONDANSETRON HCL 4 MG/2ML IJ SOLN: 4.0000 mg | Freq: Four times a day (QID) | INTRAMUSCULAR | Status: DC | PRN

## 2017-10-18 MED ORDER — CHLORHEXIDINE GLUCONATE CLOTH 2 % EX PADS: 6.0000 | MEDICATED_PAD | Freq: Once | CUTANEOUS | Status: DC

## 2017-10-18 MED ORDER — LIDOCAINE HCL (PF) 2 % IJ SOLN
INTRAMUSCULAR | Status: AC
  Filled 2017-10-18: qty 10

## 2017-10-18 MED ORDER — BUPIVACAINE HCL (PF) 0.25 % IJ SOLN
INTRAMUSCULAR | Status: AC
  Filled 2017-10-18: qty 30

## 2017-10-18 MED ORDER — LIDOCAINE HCL (CARDIAC) 20 MG/ML IV SOLN
INTRAVENOUS | Status: DC | PRN
  Administered 2017-10-18: 80 mg via INTRAVENOUS

## 2017-10-18 MED ORDER — ONDANSETRON HCL 4 MG PO TABS
4.0000 mg | ORAL_TABLET | Freq: Four times a day (QID) | ORAL | Status: DC | PRN
Start: 2017-10-18 — End: 2017-10-20

## 2017-10-18 MED ORDER — DOCUSATE SODIUM 100 MG PO CAPS
100.0000 mg | ORAL_CAPSULE | Freq: Every morning | ORAL | Status: DC
  Administered 2017-10-19 – 2017-10-20 (×2): 100 mg via ORAL
  Filled 2017-10-18 (×2): qty 1

## 2017-10-18 MED ORDER — POLYETHYLENE GLYCOL 3350 17 G PO PACK: 17.0000 g | PACK | Freq: Every day | ORAL | Status: DC | PRN

## 2017-10-18 MED ORDER — LAMOTRIGINE 100 MG PO TABS
300.0000 mg | ORAL_TABLET | Freq: Two times a day (BID) | ORAL | Status: DC
  Administered 2017-10-18 – 2017-10-20 (×4): 300 mg via ORAL
  Filled 2017-10-18 (×3): qty 3
  Filled 2017-10-18 (×2): qty 12
  Filled 2017-10-18 (×2): qty 3

## 2017-10-18 MED ORDER — ONDANSETRON HCL 4 MG/2ML IJ SOLN
INTRAMUSCULAR | Status: DC | PRN
  Administered 2017-10-18: 4 mg via INTRAVENOUS

## 2017-10-18 MED ORDER — MENTHOL 3 MG MT LOZG
1.0000 | LOZENGE | OROMUCOSAL | Status: DC | PRN
  Filled 2017-10-18: qty 9

## 2017-10-18 MED ORDER — LACTATED RINGERS IV SOLN
INTRAVENOUS | Status: DC
  Administered 2017-10-18 (×2): via INTRAVENOUS

## 2017-10-18 MED ORDER — KETOROLAC TROMETHAMINE 30 MG/ML IJ SOLN
INTRAMUSCULAR | Status: AC
  Filled 2017-10-18: qty 1

## 2017-10-18 MED ORDER — ONDANSETRON HCL 4 MG/2ML IJ SOLN
INTRAMUSCULAR | Status: AC
  Filled 2017-10-18: qty 2

## 2017-10-18 MED ORDER — METHOCARBAMOL 500 MG PO TABS: 500.0000 mg | ORAL_TABLET | Freq: Four times a day (QID) | ORAL | Status: DC | PRN

## 2017-10-18 MED ORDER — BISACODYL 10 MG RE SUPP: 10.0000 mg | Freq: Every day | RECTAL | Status: DC | PRN

## 2017-10-18 MED ORDER — ASPIRIN EC 325 MG PO TBEC
325.0000 mg | DELAYED_RELEASE_TABLET | Freq: Every day | ORAL | Status: DC
  Administered 2017-10-19: 325 mg via ORAL
  Filled 2017-10-18: qty 1

## 2017-10-18 MED ORDER — FAMOTIDINE 20 MG PO TABS
ORAL_TABLET | ORAL | Status: AC
  Administered 2017-10-18: 20 mg via ORAL
  Filled 2017-10-18: qty 1

## 2017-10-18 MED ORDER — ALUM & MAG HYDROXIDE-SIMETH 200-200-20 MG/5ML PO SUSP: 30.0000 mL | ORAL | Status: DC | PRN

## 2017-10-18 MED ORDER — OXYCODONE HCL 5 MG PO TABS
5.0000 mg | ORAL_TABLET | ORAL | Status: DC | PRN
  Administered 2017-10-19 (×4): 5 mg via ORAL
  Filled 2017-10-18 (×4): qty 1

## 2017-10-18 MED ORDER — METHOCARBAMOL 1000 MG/10ML IJ SOLN
500.0000 mg | Freq: Four times a day (QID) | INTRAVENOUS | Status: DC | PRN
  Filled 2017-10-18: qty 5

## 2017-10-18 MED ORDER — DEXMEDETOMIDINE HCL IN NACL 200 MCG/50ML IV SOLN
INTRAVENOUS | Status: DC | PRN
  Administered 2017-10-18: 4 ug via INTRAVENOUS
  Administered 2017-10-18: 12 ug via INTRAVENOUS
  Administered 2017-10-18: 4 ug via INTRAVENOUS

## 2017-10-18 MED ORDER — MAGNESIUM CITRATE PO SOLN
1.0000 | Freq: Once | ORAL | Status: DC | PRN
  Filled 2017-10-18: qty 296

## 2017-10-18 MED ORDER — CHLORHEXIDINE GLUCONATE CLOTH 2 % EX PADS
6.0000 | MEDICATED_PAD | Freq: Once | CUTANEOUS | Status: AC
  Administered 2017-10-18: 6 via TOPICAL

## 2017-10-18 MED ORDER — BLISTEX MEDICATED EX OINT
TOPICAL_OINTMENT | CUTANEOUS | Status: DC | PRN
  Filled 2017-10-18: qty 6.3

## 2017-10-18 MED ORDER — DEXAMETHASONE SODIUM PHOSPHATE 10 MG/ML IJ SOLN
INTRAMUSCULAR | Status: DC | PRN
  Administered 2017-10-18: 6 mg via INTRAVENOUS

## 2017-10-18 MED ORDER — PROPOFOL 10 MG/ML IV BOLUS
INTRAVENOUS | Status: DC | PRN
  Administered 2017-10-18: 150 mg via INTRAVENOUS

## 2017-10-18 MED ORDER — LORAZEPAM 1 MG PO TABS: 1.5000 mg | ORAL_TABLET | Freq: Three times a day (TID) | ORAL | Status: DC | PRN

## 2017-10-18 MED ORDER — MORPHINE SULFATE (PF) 2 MG/ML IV SOLN
2.0000 mg | INTRAVENOUS | Status: DC | PRN
  Administered 2017-10-18: 2 mg via INTRAVENOUS
  Filled 2017-10-18: qty 1

## 2017-10-18 MED ORDER — FAMOTIDINE 20 MG PO TABS
20.0000 mg | ORAL_TABLET | Freq: Once | ORAL | Status: AC
  Administered 2017-10-18: 20 mg via ORAL

## 2017-10-18 MED ORDER — CALCIUM CARBONATE-VITAMIN D 500-200 MG-UNIT PO TABS
1.0000 | ORAL_TABLET | Freq: Every day | ORAL | Status: DC
  Administered 2017-10-19 – 2017-10-20 (×2): 1 via ORAL
  Filled 2017-10-18 (×2): qty 1

## 2017-10-18 MED ORDER — FENTANYL CITRATE (PF) 100 MCG/2ML IJ SOLN
INTRAMUSCULAR | Status: DC | PRN
  Administered 2017-10-18: 25 ug via INTRAVENOUS
  Administered 2017-10-18: 50 ug via INTRAVENOUS
  Administered 2017-10-18: 25 ug via INTRAVENOUS
  Administered 2017-10-18 (×2): 50 ug via INTRAVENOUS
  Administered 2017-10-18: 25 ug via INTRAVENOUS
  Administered 2017-10-18: 50 ug via INTRAVENOUS
  Administered 2017-10-18 (×3): 25 ug via INTRAVENOUS

## 2017-10-18 MED ORDER — MIDAZOLAM HCL 2 MG/2ML IJ SOLN
INTRAMUSCULAR | Status: DC | PRN
  Administered 2017-10-18: 2 mg via INTRAVENOUS

## 2017-10-18 MED ORDER — NEOMYCIN-POLYMYXIN B GU 40-200000 IR SOLN
Status: AC
  Filled 2017-10-18: qty 2

## 2017-10-18 MED ORDER — CARBAMAZEPINE ER 100 MG PO TB12
300.0000 mg | ORAL_TABLET | Freq: Two times a day (BID) | ORAL | Status: DC
  Administered 2017-10-18 – 2017-10-20 (×4): 300 mg via ORAL
  Filled 2017-10-18 (×5): qty 3

## 2017-10-18 MED ORDER — SENNA 8.6 MG PO TABS
1.0000 | ORAL_TABLET | Freq: Two times a day (BID) | ORAL | Status: DC
  Administered 2017-10-18 – 2017-10-20 (×4): 8.6 mg via ORAL
  Filled 2017-10-18 (×4): qty 1

## 2017-10-18 MED ORDER — KETOROLAC TROMETHAMINE 30 MG/ML IJ SOLN
INTRAMUSCULAR | Status: DC | PRN
  Administered 2017-10-18: 30 mg via INTRAVENOUS

## 2017-10-18 MED ORDER — NEOMYCIN-POLYMYXIN B GU 40-200000 IR SOLN
Status: DC | PRN
  Administered 2017-10-18: 2 mL

## 2017-10-18 MED ORDER — FENTANYL CITRATE (PF) 100 MCG/2ML IJ SOLN: 25.0000 ug | INTRAMUSCULAR | Status: DC | PRN

## 2017-10-18 MED ORDER — CEFAZOLIN SODIUM-DEXTROSE 2-4 GM/100ML-% IV SOLN
2.0000 g | Freq: Four times a day (QID) | INTRAVENOUS | Status: AC
  Administered 2017-10-18 (×2): 2 g via INTRAVENOUS
  Filled 2017-10-18 (×2): qty 100

## 2017-10-18 MED ORDER — ADULT MULTIVITAMIN W/MINERALS CH
1.0000 | ORAL_TABLET | Freq: Every evening | ORAL | Status: DC
  Administered 2017-10-19: 1 via ORAL
  Filled 2017-10-18: qty 1

## 2017-10-18 MED ORDER — MEPERIDINE HCL 50 MG/ML IJ SOLN: 6.2500 mg | INTRAMUSCULAR | Status: DC | PRN

## 2017-10-18 MED ORDER — FENTANYL CITRATE (PF) 100 MCG/2ML IJ SOLN
INTRAMUSCULAR | Status: AC
  Filled 2017-10-18: qty 2

## 2017-10-18 MED ORDER — MIDAZOLAM HCL 2 MG/2ML IJ SOLN
INTRAMUSCULAR | Status: AC
  Filled 2017-10-18: qty 2

## 2017-10-18 MED ORDER — CARBAMAZEPINE ER 100 MG PO TB12
200.0000 mg | ORAL_TABLET | Freq: Two times a day (BID) | ORAL | Status: DC
  Administered 2017-10-18 – 2017-10-20 (×4): 200 mg via ORAL
  Filled 2017-10-18 (×3): qty 2
  Filled 2017-10-18: qty 1
  Filled 2017-10-18: qty 2

## 2017-10-18 MED ORDER — MIDAZOLAM HCL 5 MG/5ML IJ SOLN
5.0000 mg | INTRAMUSCULAR | Status: DC
  Filled 2017-10-18: qty 5

## 2017-10-18 MED ORDER — BUPIVACAINE HCL (PF) 0.25 % IJ SOLN
INTRAMUSCULAR | Status: DC | PRN
  Administered 2017-10-18: 30 mL

## 2017-10-18 MED ORDER — OMEGA-3-ACID ETHYL ESTERS 1 G PO CAPS
1.0000 g | ORAL_CAPSULE | Freq: Every day | ORAL | Status: DC
  Administered 2017-10-19 – 2017-10-20 (×2): 1 g via ORAL
  Filled 2017-10-18 (×2): qty 1

## 2017-10-18 MED ORDER — GUAIFENESIN 100 MG/5ML PO SYRP
100.0000 mg | ORAL_SOLUTION | ORAL | Status: DC | PRN
  Filled 2017-10-18: qty 5

## 2017-10-18 MED ORDER — PROPOFOL 10 MG/ML IV BOLUS
INTRAVENOUS | Status: AC
  Filled 2017-10-18: qty 20

## 2017-10-18 MED ORDER — LACTULOSE 10 GM/15ML PO SOLN
13.3333 g | Freq: Every evening | ORAL | Status: DC
  Administered 2017-10-19: 13.3333 g via ORAL
  Filled 2017-10-18: qty 30

## 2017-10-18 MED ORDER — PHENOL 1.4 % MT LIQD
1.0000 | OROMUCOSAL | Status: DC | PRN
  Filled 2017-10-18: qty 177

## 2017-10-18 MED ORDER — ACETAMINOPHEN 325 MG PO TABS
650.0000 mg | ORAL_TABLET | Freq: Four times a day (QID) | ORAL | Status: DC | PRN
  Administered 2017-10-18 – 2017-10-20 (×3): 650 mg via ORAL
  Filled 2017-10-18 (×3): qty 2

## 2017-10-18 MED ORDER — OXYCODONE HCL 5 MG PO TABS: 5.0000 mg | ORAL_TABLET | Freq: Once | ORAL | Status: DC | PRN

## 2017-10-18 MED ORDER — SODIUM CHLORIDE 0.9 % IV SOLN
75.0000 mL/h | INTRAVENOUS | Status: DC
  Administered 2017-10-18 – 2017-10-19 (×2): 75 mL/h via INTRAVENOUS

## 2017-10-18 SURGICAL SUPPLY — 59 items
BANDAGE ELASTIC 4 LF NS (GAUZE/BANDAGES/DRESSINGS) ×6 IMPLANT
BIT DRILL CANN 2.7X625 NONSTRL (BIT) ×3 IMPLANT
BIT DRILL QC 2X125 (BIT) ×1 IMPLANT
BLADE SURG 15 STRL LF DISP TIS (BLADE) ×1 IMPLANT
BLADE SURG 15 STRL SS (BLADE) ×2
BNDG ESMARK 6X12 TAN STRL LF (GAUZE/BANDAGES/DRESSINGS) ×3 IMPLANT
BONE CANC CHIPS 20CC PCAN1/4 (Bone Implant) ×3 IMPLANT
CHIPS CANC BONE 20CC PCAN1/4 (Bone Implant) ×1 IMPLANT
CLOSURE WOUND 1/2 X4 (GAUZE/BANDAGES/DRESSINGS) ×2
CUFF TOURN 24 STER (MISCELLANEOUS) ×3 IMPLANT
CUFF TOURN 30 STER DUAL PORT (MISCELLANEOUS) IMPLANT
DRAPE FLUOR MINI C-ARM 54X84 (DRAPES) ×3 IMPLANT
DRAPE INCISE IOBAN 66X45 STRL (DRAPES) ×3 IMPLANT
DRAPE U-SHAPE 47X51 STRL (DRAPES) ×3 IMPLANT
DRILL BIT QC 2X125 (BIT) ×2
DURAPREP 26ML APPLICATOR (WOUND CARE) ×6 IMPLANT
ELECT REM PT RETURN 9FT ADLT (ELECTROSURGICAL) ×3
ELECTRODE REM PT RTRN 9FT ADLT (ELECTROSURGICAL) ×1 IMPLANT
GAUZE PETRO XEROFOAM 1X8 (MISCELLANEOUS) ×3 IMPLANT
GAUZE SPONGE 4X4 12PLY STRL (GAUZE/BANDAGES/DRESSINGS) ×3 IMPLANT
GLOVE BIOGEL PI IND STRL 7.5 (GLOVE) ×6 IMPLANT
GLOVE BIOGEL PI IND STRL 9 (GLOVE) ×1 IMPLANT
GLOVE BIOGEL PI INDICATOR 7.5 (GLOVE) ×12
GLOVE BIOGEL PI INDICATOR 9 (GLOVE) ×2
GLOVE SURG 9.0 ORTHO LTXF (GLOVE) ×6 IMPLANT
GOWN STRL REUS TWL 2XL XL LVL4 (GOWN DISPOSABLE) ×3 IMPLANT
GOWN STRL REUS W/ TWL LRG LVL3 (GOWN DISPOSABLE) ×2 IMPLANT
GOWN STRL REUS W/TWL LRG LVL3 (GOWN DISPOSABLE) ×4
GUIDEWARE NON THREAD 1.25X150 (WIRE) ×6
GUIDEWIRE NON THREAD 1.25X150 (WIRE) ×2 IMPLANT
HANDLE YANKAUER SUCT BULB TIP (MISCELLANEOUS) ×3 IMPLANT
KIT RM TURNOVER STRD PROC AR (KITS) ×3 IMPLANT
LABEL OR SOLS (LABEL) ×3 IMPLANT
NS IRRIG 1000ML POUR BTL (IV SOLUTION) ×3 IMPLANT
PACK EXTREMITY ARMC (MISCELLANEOUS) ×3 IMPLANT
PAD ABD DERMACEA PRESS 5X9 (GAUZE/BANDAGES/DRESSINGS) ×12 IMPLANT
PAD CAST CTTN 4X4 STRL (SOFTGOODS) ×3 IMPLANT
PADDING CAST COTTON 4X4 STRL (SOFTGOODS) ×6
PROS LCP PLATE 6H 85MM (Plate) ×3 IMPLANT
PROSTHESIS LCP PLATE 6H 85MM (Plate) ×1 IMPLANT
SCREW CANN L THRD/50 4.0 (Screw) ×6 IMPLANT
SCREW CORTEX 2.7X12MM (Screw) ×6 IMPLANT
SCREW CORTEX 2.7X14MM (Screw) ×3 IMPLANT
SCREW CORTEX 2.7X16MM (Screw) ×3 IMPLANT
SCREW CORTEX 3.5 16MM (Screw) ×2 IMPLANT
SCREW LOCK CORT ST 3.5X16 (Screw) ×1 IMPLANT
SPLINT CAST 1 STEP 4X30 (MISCELLANEOUS) ×6 IMPLANT
SPONGE LAP 18X18 5 PK (GAUZE/BANDAGES/DRESSINGS) ×3 IMPLANT
STAPLER SKIN PROX 35W (STAPLE) ×3 IMPLANT
STOCKINETTE STRL 6IN 960660 (GAUZE/BANDAGES/DRESSINGS) ×3 IMPLANT
STRIP CLOSURE SKIN 1/2X4 (GAUZE/BANDAGES/DRESSINGS) ×4 IMPLANT
SUT VIC AB 2-0 SH 27 (SUTURE) ×4
SUT VIC AB 2-0 SH 27XBRD (SUTURE) ×2 IMPLANT
SYR 30ML LL (SYRINGE) ×3 IMPLANT
SYR 3ML 18GX1 1/2 (SYRINGE) ×3 IMPLANT
TAPE MICROPORE 2IN (TAPE) IMPLANT
TUBING CONNECTING 10 (TUBING) ×2 IMPLANT
TUBING CONNECTING 10' (TUBING) ×1
WASHER 7MM DIA (Washer) ×6 IMPLANT

## 2017-10-18 NOTE — Anesthesia Post-op Follow-up Note (Signed)
Anesthesia QCDR form completed.        

## 2017-10-18 NOTE — Progress Notes (Signed)
Notified Dr Martha ClanKrasinski of elevated heart rate sustaining in 130's. Received order for hospitalist consult.

## 2017-10-18 NOTE — Anesthesia Procedure Notes (Signed)
Procedure Name: LMA Insertion Date/Time: 10/18/2017 10:15 AM Performed by: Dava NajjarFrazier, Candelaria Pies, CRNA Pre-anesthesia Checklist: Patient identified, Emergency Drugs available, Suction available, Patient being monitored and Timeout performed Patient Re-evaluated:Patient Re-evaluated prior to induction Oxygen Delivery Method: Circle system utilized Preoxygenation: Pre-oxygenation with 100% oxygen Induction Type: IV induction Ventilation: Mask ventilation without difficulty LMA: LMA inserted LMA Size: 4.0 Number of attempts: 1 Placement Confirmation: positive ETCO2 and breath sounds checked- equal and bilateral Tube secured with: Tape Dental Injury: Teeth and Oropharynx as per pre-operative assessment

## 2017-10-18 NOTE — Consult Note (Signed)
[facility] [place] at [facility]   PATIENT NAME: Angelica Stevenson    MR#:  [id number]  DATE OF BIRTH:  December 05, 1995  DATE OF ADMISSION:  10/18/2017  PRIMARY CARE PHYSICIAN: Dorcas Carrow, DO   REQUESTING/REFERRING PHYSICIAN: Real Cons, MD  CHIEF COMPLAINT:  Ankle Fracture  HISTORY OF PRESENT ILLNESS:  Angelica Stevenson  is a 22 y.o. female who presents for operative repair of ankle fracture.  Patient underwent ORIF today by Dr Real Cons.  Tonight she developed persistent tachycardia, and hospitalists were called for consult.  PAST MEDICAL HISTORY:   Past Medical History:  Diagnosis Date  . Attention deficit disorder (ADD) without hyperactivity   . Constipation   . Partial epilepsy with impairment of consciousness, intractable (HCC)   . Speech abnormality   . Static encephalopathy     PAST SURGICAL HISTOIRY:   Past Surgical History:  Procedure Laterality Date  . ORIF ANKLE FRACTURE Left 10/18/2017   Procedure: OPEN REDUCTION INTERNAL FIXATION (ORIF) ANKLE FRACTURE;  Surgeon: Juanell Fairly, MD;  Location: [facility] ORS;  Service: Orthopedics;  Laterality: Left;    SOCIAL HISTORY:   Social History   Tobacco Use  . Smoking status: Never Smoker  . Smokeless tobacco: Never Used  Substance Use Topics  . Alcohol use: No    FAMILY HISTORY:  History reviewed. No pertinent family history.  DRUG ALLERGIES:   Allergies  Allergen Reactions  . Influenza Vaccines Other (See Comments)    seizures    REVIEW OF SYSTEMS:  Review of Systems  Constitutional: Negative for chills, fever, malaise/fatigue and weight loss.  HENT: Negative for ear pain, hearing loss and tinnitus.   Eyes: Negative for blurred vision, double vision, pain and redness.  Respiratory: Negative for cough, hemoptysis and shortness of breath.   Cardiovascular: Negative for chest pain, palpitations, orthopnea and leg swelling.  Gastrointestinal: Negative for abdominal pain,  constipation, diarrhea, nausea and vomiting.  Genitourinary: Negative for dysuria, frequency and hematuria.  Musculoskeletal: Positive for joint pain (ankle pain). Negative for back pain and neck pain.  Skin:       No acne, rash, or lesions  Neurological: Positive for headaches. Negative for dizziness, tremors, focal weakness and weakness.  Endo/Heme/Allergies: Negative for polydipsia. Does not bruise/bleed easily.  Psychiatric/Behavioral: Negative for depression. The patient is not nervous/anxious and does not have insomnia.     MEDICATIONS AT HOME:   Prior to Admission medications   Medication Sig Start Date End Date Taking? Authorizing Provider  Calcium Carb-Cholecalciferol (CALCIUM-VITAMIN D) 500-200 MG-UNIT tablet Take 1 tablet by mouth daily.   Yes [provider]  carbamazepine (CARBATROL) 200 MG 12 hr capsule Take 200 mg by mouth 2 (two) times daily. (0700 & 1900)   Yes [provider]  carbamazepine (CARBATROL) 300 MG 12 hr capsule Take 300 mg by mouth 2 (two) times daily. (0700 &1900)   Yes [provider]  lactulose (CHRONULAC) 10 GM/15ML solution Take 13.3333 g by mouth every evening. (1900)Take 20 mls by mouth every evening   Yes [provider]  lamoTRIgine (LAMICTAL) 150 MG tablet Take 300 mg by mouth 2 (two) times daily. (0700 &1900)   Yes [provider]  LO LOESTRIN FE 1 MG-10 MCG / 10 MCG tablet TAKE ONE TABLET BY MOUTH EVERY DAY Patient taking differently: TAKE ONE TABLET BY MOUTH EVERY DAY AT 0700. 01/16/17  Yes Johnson, Megan P, DO  Magnesium Oxide 250 MG TABS Take 250 mg by mouth daily at 6 (six) AM. (  0600)   Yes [provider]  midazolam (VERSED) 5 MG/ML injection Place 5 mg into the nose See admin instructions. Place 1 ml (5 mg) in each nostril or on each side mouth between cheek and gum for total of 10 mg for seizure > 5 minutes 10/05/15  Yes [provider]  Multiple Vitamin (THEREMS) TABS TAKE ONE TABLET  BY MOUTH EVERY EVENING Patient taking differently: TAKE ONE TABLET BY MOUTH EVERY EVENING AT 1900 03/22/17  Yes Johnson, Megan P, DO  omega-3 acid ethyl esters (LOVAZA) 1 g capsule Take 1 g by mouth daily. (0700)   Yes [provider]  risperiDONE (RISPERDAL) 1 MG tablet Take 1 mg by mouth 2 (two) times daily. (0700 &1900)   Yes [provider]  STOOL SOFTENER 100 MG capsule TAKE ONE CAPSULE BY MOUTH EVERY MORNING Patient taking differently: TAKE ONE CAPSULE BY MOUTH EVERY MORNING AT 0700. 05/04/17  Yes Johnson, Megan P, DO  acetaminophen (TYLENOL) 160 MG/5ML liquid Take 20 mLs (640 mg total) by mouth every 6 (six) hours as needed for fever or pain. Patient not taking: Reported on 10/18/2017 12/01/16   Jene EveryKinner, Robert, MD  albuterol (PROVENTIL HFA;VENTOLIN HFA) 108 (90 Base) MCG/ACT inhaler Inhale 2 puffs into the lungs every 6 (six) hours as needed for wheezing or shortness of breath. Patient not taking: Reported on 10/18/2017 11/01/16   Olevia PerchesJohnson, Megan P, DO  guaifenesin (ROBITUSSIN) 100 MG/5ML syrup Take 100 mg by mouth every 4 (four) hours as needed for cough.     [provider]  LORazepam (ATIVAN) 1 MG tablet Take 1.5 mg by mouth every 8 (eight) hours as needed for seizure (after any cluster of seizures, or prolonged seizure,).  11/06/15   [provider]      VITAL SIGNS:   Vitals:   10/18/17 1730 10/18/17 1830 10/18/17 1937 10/18/17 1938  BP:  (!) 104/55 (!) 110/51   Pulse: (!) 112 (!) 150 (!) 138   Resp:   19   Temp:  99.2 F (37.3 C) (!) 101.3 F (38.5 C) 99.7 F (37.6 C)  TempSrc:  Oral Axillary Oral  SpO2:  97% 95%   Weight:      Height:       Wt Readings from Last 3 Encounters:  10/18/17 78 kg (172 lb) (92 %, Z= 1.42)*  10/16/17 77.1 kg (170 lb) (92 %, Z= 1.37)*  12/13/16 75.3 kg (166 lb) (91 %, Z= 1.33)*   * Growth percentiles are based on CDC (Girls, 2-20 Years) data.    PHYSICAL EXAMINATION:  Physical Exam  Vitals  reviewed. Constitutional: She is oriented to person, place, and time. She appears well-developed and well-nourished. No distress.  HENT:  Head: Normocephalic and atraumatic.  Mouth/Throat: Oropharynx is clear and moist.  Eyes: Conjunctivae and EOM are normal. Pupils are equal, round, and reactive to light. No scleral icterus.  Neck: Normal range of motion. Neck supple. No JVD present. No thyromegaly present.  Cardiovascular: Normal rate, regular rhythm and intact distal pulses. Exam reveals no gallop and no friction rub.  No murmur heard. Respiratory: Effort normal and breath sounds normal. No respiratory distress. She has no wheezes. She has no rales.  GI: Soft. Bowel sounds are normal. She exhibits no distension. There is no tenderness.  Musculoskeletal: Normal range of motion. She exhibits tenderness (left ankle). She exhibits no edema.  No arthritis, no gout  Lymphadenopathy:    She has no cervical adenopathy.  Neurological: She is alert and  oriented to person, place, and time. No cranial nerve deficit.  No dysarthria, no aphasia  Skin: Skin is warm and dry. No rash noted. No erythema.  Psychiatric: She has a normal mood and affect. Her behavior is normal. Judgment and thought content normal.     LABORATORY PANEL:   CBC No results for input(s): WBC, HGB, HCT, PLT in the last 168 hours. ------------------------------------------------------------------------------------------------------------------  Chemistries  No results for input(s): NA, K, CL, CO2, GLUCOSE, BUN, CREATININE, CALCIUM, MG, AST, ALT, ALKPHOS, BILITOT in the last 168 hours.  Invalid input(s): GFRCGP ------------------------------------------------------------------------------------------------------------------  Cardiac Enzymes No results for input(s): TROPONINI in the last 168 hours. ------------------------------------------------------------------------------------------------------------------  RADIOLOGY:   Dg Ankle Left Port  Result Date: 10/18/2017 CLINICAL DATA:  Status post ORIF for bimalleolar left ankle fracture. EXAM: PORTABLE LEFT ANKLE - 2 VIEW COMPARISON:  Preoperative study of June 12, 2017 FINDINGS: The patient has undergone plate and screw fixation of the mildly displaced distal fibular fracture. Alignment is near anatomic. Two screws been placed through the medial malleolar fracture with reduction of same. IMPRESSION: No immediate postprocedure complication following plate and screw fixation of a bimalleolar fracture. Electronically Signed   By: Asencion Loveday  [place] M.D.   On: 10/18/2017 13:36    EKG:   Orders placed or performed during the hospital encounter of 10/18/17  . EKG 12-Lead  . EKG 12-Lead    IMPRESSION AND PLAN:  Active Problems:   Bimalleolar ankle fracture, left, closed, initial encounter - s/p ORIF by orthopedic surgery, defer to their recommendations for management of this problem.   Tachycardia - EKG shows sinus tachycardia, suspect pain reaction as patient reports headache and ankle pain.  Will treat pain appropriately and if HR persists >120 afterwards, will give low dose beta blocker.  All the records are reviewed and case discussed with ED provider. Management plans discussed with the patient and/or family.  CODE STATUS: Full    Code Status Orders  (From admission, onward)        Start     Ordered   10/18/17 1413  Full code  Continuous     10/18/17 1413    Code Status History    Date Active Date Inactive Code Status Order ID Comments User Context   This patient has a current code status but no historical code status.    Advance Directive Documentation     Most Recent Value  Type of Advance Directive  Healthcare Power of Attorney  Pre-existing out of facility DNR order (yellow form or pink MOST form)  No data  "MOST" Form in Place?  No data      TOTAL TIME TAKING CARE OF THIS PATIENT: 40 minutes.    Jaelynne Hockley FIELDING 10/18/2017, 10:12  PM  [employer]  [phone]  CC: Primary care Physician: Dorcas Carrow, DO  Note:  This document was prepared using Dragon voice recognition software and may include unintentional dictation errors.

## 2017-10-18 NOTE — Anesthesia Postprocedure Evaluation (Signed)
Anesthesia Post Note  Patient: Marcia BrashLauren Wickens  Procedure(s) Performed: OPEN REDUCTION INTERNAL FIXATION (ORIF) ANKLE FRACTURE (Left Ankle)  Patient location during evaluation: PACU Anesthesia Type: General Level of consciousness: awake and alert and oriented Pain management: pain level controlled Vital Signs Assessment: post-procedure vital signs reviewed and stable Respiratory status: spontaneous breathing, nonlabored ventilation and respiratory function stable Cardiovascular status: blood pressure returned to baseline and stable Postop Assessment: no signs of nausea or vomiting Anesthetic complications: no     Last Vitals:  Vitals:   10/18/17 1347 10/18/17 1414  BP: 137/84 127/70  Pulse:  (!) 101  Resp: 16 16  Temp:  36.9 C  SpO2:  96%    Last Pain:  Vitals:   10/18/17 1415  TempSrc:   PainSc: Asleep                 Javeion Cannedy

## 2017-10-18 NOTE — Op Note (Signed)
10/18/2017  1:32 PM  PATIENT:  Angelica Stevenson    PRE-OPERATIVE DIAGNOSIS:  Displaced bimalleolar fracture-left ankle  POST-OPERATIVE DIAGNOSIS:  Same  PROCEDURE:  OPEN REDUCTION INTERNAL FIXATION (ORIF) LEFT ANKLE FRACTURE  SURGEON:  Thornton Park, MD  ANESTHESIA:   General  PREOPERATIVE INDICATIONS:  Angelica Stevenson is a  22 y.o. female with a diagnosis of displaced bimalleolar fracture-left ankle.  She had a trimalleolar fracture in the same ankle a few months ago.  Approximately 3 weeks ago she sustained another left ankle injury with an apparent new bimalleolar fracture.  A closed reduction attempt with casting was performed in the office, but the reduction still left the ankle with a valgus deformity.  Therefore I recommended an ORIF.  I discussed the risks and benefits of surgery with the patient's father in clinic. He understands the risks include but are not limited to infection, bleeding requiring blood transfusion, nerve or blood vessel injury, joint stiffness or loss of motion, persistent pain, weakness or instability, malunion, nonunion and hardware failure and the need for further surgery. Medical risks include but are not limited to DVT and pulmonary embolism, myocardial infarction, stroke, pneumonia, respiratory failure and death. The patient's father understood these risks and wished to proceed.   OPERATIVE IMPLANTS: Synthes 3.5 LCDC plate for lateral fixation and two 4.0 cannulated screws for medial fixation  OPERATIVE FINDINGS: Partially healed bimalleolar fracture  OPERATIVE PROCEDURE:   Patient was met in the preoperative area. The left leg was signed my initials and the word yes according the hospital's correct site of surgery protocol. She was brought to the operating room where she underwent general anesthesia. The patient was placed supine on the operative table. A bump was placed under the left hip. A tourniquet was applied to the left thigh.  The lower extremity  was prepped and draped in a sterile fashion. A timeout was performed to verify the patient's name, date of birth, medical record number, correct site of surgery and correct procedure to be performed. It was also used to verify the patient received antibiotics, and that all appropriate instruments, implants and radiographic studies were available in the room. Once all in attendance were in agreement, the case began.  The left lower extremity was exsanguinated with an Esmarch. The tourniquet was inflated to 275 mmHg. This was applied for a total of 101 minutes. A lateral incision was made over the fibula. The subcutaneous tissues were dissected with the Metzenbaum scissor and pickup. Care was taken to avoid injury to the superficial peroneal nerve. The lateral malleolus fracture was identified.  Callus had begun to form around the lateral malleolus and was broken up with a periosteal elevator.  A currette was used to remove any callus and scar from the fracture site.  The distal lateral malleolar fragment was fully mobilized.  The fracture was copiously irrigated.  The attention was then turned to the medial side.  A small vertical incision was made over the medial malleolus. Soft tissue was dissected with some with the Metzenbaum scissor and pickup. The fracture of the medial malleolus was identified. Again callus was encountered and debrided with a small rongeur and periosteal elevator and currette.  The medial malleolus fracture was fully mobilized and reduced and held into position using two 1.25 K wires.  This allowed for anatomic positioning of the ankle joint and reduction of the lateral malleolus fracture.    Attention was turned back to the lateral side.  A 6 hole 3.5 LCDC plate from  the Synthes small fragment set was then positioned along the lateral malleolus.  Bicortical screws x 4 were placed above the fracture and a single locking screw was placed in the small distal lateral malleolus fragment.     The 4.0 cannulated screws were then placed medially, overdrilling the K wires previously placed.  Both screws were 12m long thread screws with washers.  Bone graft with cancellous chips was performed for the small voids created from taking down the callus of the medial and lateral malleolar fractures.  Good compression was achieved of the medial malleolus fracture.  Final fluoroscan imaging of the fracture reduction and hardware were taken.  The fractures were reduced to near anatomic positioning, the mortise was symmetric and there was no widening of the syndesmosis.   The medial and lateral incisions were then copiously irrigated. The subcutaneous tissue was closed with 2-0 Vicryl and the skin approximated staples. A dry sterile dressing was applied along with an AO splint. The patient's ankle was positioned in neutral. The patient was then awoken from anesthesia, transferred to hospital bed and brought to the PACU in stable condition. I was scrubbed and present the entire case and all sharp and instrument counts were correct at conclusion the case. I spoke to the patient's parents in the post-op consultation room to let them know the case was performed without complication and the patient was stable in recovery room.    KTimoteo Gaul MD

## 2017-10-18 NOTE — H&P (Signed)
PREOPERATIVE H&P  Chief Complaint: [id number] Displaced bimalleolar fracture of left lower leg, init  HPI: Angelica Stevenson is a 22 y.o. female who presents for preoperative history and physical with a diagnosis of S82.842a Displaced bimalleolar fracture of left lower leg, init.  Patient had recently recovered from a nondisplaced trimalleolar fracture in the left ankle.  She had a fall and now has a bimalleolar fracture.  Given displacement of the fracture, I have recommended ORIF for her left ankle fracture.  Patient has mental retardation and lives in a group home.  She has a history of a seizure disorder and has been cleared for surgery by neurology.  Past Medical History:  Diagnosis Date  . Attention deficit disorder (ADD) without hyperactivity   . Constipation   . Partial epilepsy with impairment of consciousness, intractable (HCC)   . Speech abnormality   . Static encephalopathy    History reviewed. No pertinent surgical history. Social History   Socioeconomic History  . Marital status: Single    Spouse name: None  . Number of children: None  . Years of education: None  . Highest education level: None  Social Needs  . Financial resource strain: None  . Food insecurity - worry: None  . Food insecurity - inability: None  . Transportation needs - medical: None  . Transportation needs - non-medical: None  Occupational History  . None  Tobacco Use  . Smoking status: Never Smoker  . Smokeless tobacco: Never Used  Substance and Sexual Activity  . Alcohol use: No  . Drug use: No  . Sexual activity: None  Other Topics Concern  . None  Social History Narrative  . None   History reviewed. No pertinent family history. Allergies  Allergen Reactions  . Influenza Vaccines Other (See Comments)    seizures   Prior to Admission medications   Medication Sig Start Date End Date Taking? Authorizing Provider  Calcium Carb-Cholecalciferol (CALCIUM-VITAMIN D) 500-200 MG-UNIT tablet  Take 1 tablet by mouth daily.   Yes [provider]  carbamazepine (CARBATROL) 200 MG 12 hr capsule Take 200 mg by mouth 2 (two) times daily. (0700 & 1900)   Yes [provider]  carbamazepine (CARBATROL) 300 MG 12 hr capsule Take 300 mg by mouth 2 (two) times daily. (0700 &1900)   Yes [provider]  lactulose (CHRONULAC) 10 GM/15ML solution Take 13.3333 g by mouth every evening. (1900)Take 20 mls by mouth every evening   Yes [provider]  lamoTRIgine (LAMICTAL) 150 MG tablet Take 300 mg by mouth 2 (two) times daily. (0700 &1900)   Yes [provider]  LO LOESTRIN FE 1 MG-10 MCG / 10 MCG tablet TAKE ONE TABLET BY MOUTH EVERY DAY Patient taking differently: TAKE ONE TABLET BY MOUTH EVERY DAY AT 0700. 01/16/17  Yes Johnson, Megan P, DO  Magnesium Oxide 250 MG TABS Take 250 mg by mouth daily at 6 (six) AM. (0600)   Yes [provider]  midazolam (VERSED) 5 MG/ML injection Place 5 mg into the nose See admin instructions. Place 1 ml (5 mg) in each nostril or on each side mouth between cheek and gum for total of 10 mg for seizure > 5 minutes 10/05/15  Yes [provider]  Multiple Vitamin (THEREMS) TABS TAKE ONE TABLET BY MOUTH EVERY EVENING Patient taking differently: TAKE ONE TABLET BY MOUTH EVERY EVENING AT 1900 03/22/17  Yes Johnson, Megan P, DO  omega-3 acid ethyl esters (LOVAZA) 1 g capsule Take 1 g by  mouth daily. (0700)   Yes [provider]  risperiDONE (RISPERDAL) 1 MG tablet Take 1 mg by mouth 2 (two) times daily. (0700 &1900)   Yes [provider]  STOOL SOFTENER 100 MG capsule TAKE ONE CAPSULE BY MOUTH EVERY MORNING Patient taking differently: TAKE ONE CAPSULE BY MOUTH EVERY MORNING AT 0700. 05/04/17  Yes Johnson, Megan P, DO  acetaminophen (TYLENOL) 160 MG/5ML liquid Take 20 mLs (640 mg total) by mouth every 6 (six) hours as needed for fever or pain. Patient not taking: Reported on 10/18/2017 12/01/16   Jene Every, MD  albuterol (PROVENTIL HFA;VENTOLIN HFA) 108 (90 Base) MCG/ACT inhaler Inhale 2 puffs into the lungs every 6 (six) hours as needed for wheezing or shortness of breath. Patient not taking: Reported on 10/18/2017 11/01/16   Olevia Perches P, DO  guaifenesin (ROBITUSSIN) 100 MG/5ML syrup Take 100 mg by mouth every 4 (four) hours as needed for cough.     [provider]  LORazepam (ATIVAN) 1 MG tablet Take 1.5 mg by mouth every 8 (eight) hours as needed for seizure (after any cluster of seizures, or prolonged seizure,).  11/06/15   [provider]     Positive ROS: All other systems have been reviewed and were otherwise negative with the exception of those mentioned in the HPI and as above.  Physical Exam: General: Alert, no acute distress Cardiovascular: Regular rate and rhythm, no murmurs rubs or gallops.  No pedal edema Respiratory: Clear to auscultation bilaterally, no wheezes rales or rhonchi. No cyanosis, no use of accessory musculature GI: No organomegaly, abdomen is soft and non-tender nondistended with positive bowel sounds. Skin: Skin intact, no lesions within the operative field. Neurologic: Sensation intact distally Psychiatric: Patient is competent for consent with normal mood and affect Lymphatic: No cervical lymphadenopathy  MUSCULOSKELETAL: Left lower extremity:  Short leg cast in place.  NVI.  Intact motor function.  Assessment: S82.842a Displaced bimalleolar fracture of left lower leg, init  Plan: Plan for Procedure(s): OPEN REDUCTION INTERNAL FIXATION (ORIF) LEFT ANKLE FRACTURE  I discussed the risks and benefits of surgery with the patient's father. He understands the risks include but are not limited to infection, bleeding requiring blood transfusion, nerve or blood vessel injury, joint stiffness or loss of motion, persistent pain, weakness or instability, malunion, nonunion and hardware failure and the need for further surgery. Medical risks  include but are not limited to DVT and pulmonary embolism, myocardial infarction, stroke, pneumonia, respiratory failure and death. Patient's father  understood these risks and wished to proceed.     Juanell Fairly, MD   10/18/2017 9:44 AM

## 2017-10-18 NOTE — Telephone Encounter (Signed)
Copied from CRM [id number]. Topic: General - Other >> Oct 18, 2017 10:19 AM Stephannie LiSimmons, Janett L, NT wrote: Reason for CRM: Patients dad called and prefers the office to call him or mom in reference to the patient in addition to the  , group home ,  dad's  Claris GowerSteve Manner  [phone] , Raynelle DickJoy Linney mom [phone] would like to be updated with any issues ,ex.  dad says were last minute with an issue that could have been handled yesterday

## 2017-10-18 NOTE — Telephone Encounter (Signed)
Routing to CMA and Provider. Patient needs this blood work before surgical clearance.

## 2017-10-18 NOTE — Progress Notes (Addendum)
Patient's HR 120's to 130's since admit. HR in the high 110's in PACU.  MD notified. New orders stated if patient's HR greater than 130 sustained notify MD.  IV fluids running at 75cc/hr.

## 2017-10-18 NOTE — Care Management (Addendum)
[facility] from [facility]. S/P ORIF left ankle fracture 1/10. PT pending. CSW made aware. Following progression.

## 2017-10-18 NOTE — Transfer of Care (Signed)
Immediate Anesthesia Transfer of Care Note  Patient: Angelica [facility]  Procedure(s) Performed: OPEN REDUCTION INTERNAL FIXATION (ORIF) ANKLE FRACTURE (Left Ankle)  Patient Location: PACU  Anesthesia Type:General  Level of Consciousness: drowsy  Airway & Oxygen Therapy: Patient Spontanous Breathing and Patient connected to nasal cannula oxygen  Post-op Assessment: Report given to RN and Post -op Vital signs reviewed and stable  Post vital signs: Reviewed and stable  Last Vitals:  Vitals:   10/18/17 0835 10/18/17 1303  BP: 114/70 (!) (P) 152/83  Pulse: 98 (!) (P) 128  Resp: 16 (!) (P) 22  Temp: (!) 36.3 C (P) 36.4 C  SpO2: 98% (P) 100%    Last Pain:  Vitals:   10/18/17 1303  TempSrc:   PainSc: (P) Asleep         Complications: No apparent anesthesia complications

## 2017-10-18 NOTE — OR Nursing (Signed)
Dr Martha ClanKrasinski and Dr Priscella MannPenwarden aware that preop labs were not drawn that PCP ordered, both okay to proceed without labs and just with Neuro clearance.

## 2017-10-18 NOTE — Telephone Encounter (Signed)
Noted  

## 2017-10-18 NOTE — Anesthesia Preprocedure Evaluation (Signed)
Anesthesia Evaluation  Patient identified by MRN, date of birth, ID band Patient awake    Reviewed: Allergy & Precautions, NPO status , Patient's Chart, lab work & pertinent test results  History of Anesthesia Complications Negative for: history of anesthetic complications  Airway Mallampati: III  TM Distance: >3 FB Neck ROM: Full    Dental no notable dental hx.    Pulmonary neg pulmonary ROS, neg sleep apnea, neg COPD,    breath sounds clear to auscultation- rhonchi (-) wheezing      Cardiovascular Exercise Tolerance: Good (-) hypertension(-) CAD, (-) Past MI and (-) Cardiac Stents  Rhythm:Regular Rate:Normal - Systolic murmurs and - Diastolic murmurs    Neuro/Psych Seizures -, Poorly Controlled,  PSYCHIATRIC DISORDERS    GI/Hepatic negative GI ROS, Neg liver ROS,   Endo/Other  negative endocrine ROSneg diabetes  Renal/GU negative Renal ROS     Musculoskeletal Ankle fracture    Abdominal (+) - obese,   Peds  Hematology negative hematology ROS (+)   Anesthesia Other Findings Past Medical History: No date: Attention deficit disorder (ADD) without hyperactivity No date: Constipation No date: Partial epilepsy with impairment of consciousness,  intractable (HCC) No date: Speech abnormality No date: Static encephalopathy   Reproductive/Obstetrics                             Anesthesia Physical Anesthesia Plan  ASA: II  Anesthesia Plan: General   Post-op Pain Management:    Induction: Intravenous  PONV Risk Score and Plan: 2 and Ondansetron  Airway Management Planned: LMA  Additional Equipment:   Intra-op Plan:   Post-operative Plan:   Informed Consent: I have reviewed the patients History and Physical, chart, labs and discussed the procedure including the risks, benefits and alternatives for the proposed anesthesia with the patient or authorized representative who has  indicated his/her understanding and acceptance.   Dental advisory given  Plan Discussed with: CRNA and Anesthesiologist  Anesthesia Plan Comments:         Anesthesia Quick Evaluation

## 2017-10-18 NOTE — Telephone Encounter (Signed)
Copied from CRM [id number]. Topic: Inquiry >> Oct 17, 2017  4:25 PM Terisa Starraylor, Brittany L wrote:  Message from roxy @ [facility] ([facility] to [facility])    She said the patient did pre op yesterday and nobody said anything about blood work. Call back is  [phone]

## 2017-10-18 NOTE — Telephone Encounter (Signed)
FYI

## 2017-10-18 NOTE — Clinical Social Work Note (Signed)
Clinical Social Work Assessment  Patient Details  Name: Angelica [facility] MRN: [id number] Date of Birth: Dec 31, 1995  Date of referral:  10/18/17               Reason for consult:  Other (Comment Required)(From [place]. )                Permission sought to share information with:  Facility Art therapist granted to share information::  Yes, Verbal Permission Granted  Name::        Agency::     Relationship::     Contact Information:     Housing/Transportation Living arrangements for the past 2 months:  Group Home Source of Information:  Parent, Guardian, Facility Patient Interpreter Needed:  None Criminal Activity/Legal Involvement Pertinent to Current Situation/Hospitalization:  No - Comment as needed Significant Relationships:  Parents Lives with:  Facility Resident Do you feel safe going back to the place where you live?  Yes Need for family participation in patient care:  Yes (Comment)  Care giving concerns:  Patient is a resident at [facility] (fax: [phone]).    Social Worker assessment / plan:  Holiday representative (CSW) received SNF consult. Per chart patient is from [facility] group home. Patient had surgery today. CSW met with patient and her parents Angelica [facility] [phone] and Angelica [facility] [phone] were at bedside. Patient was asleep during assessment. Per Angelica [facility] she is patient's mother and legal guardian. Angelica [facility] reported that patient has been at [facility] for 2 years now. Per Angelica [facility] and Angelica [facility] patient has been walking in a boot and using the wheel chair at the group home. Patient's parents reported that patient has 24/7 care at the group home and is comfortable with the staff. Parents prefer for patient to return to the group home. CSW explained that if patient is not able to return to the group home for whatever reason then SNF placement can be pursued and BCBS would have to approve it. CSW explained that if BCBS did not approve SNF then a  medicaid SNF bed can be pursued however it would likely be outside of [facility].   Patient's parents strongly prefer for patient to return to [facility] group home. CSW contacted Angelica [facility] nurse for [facility] and made her aware of above. Per Angelica Stevenson patient can return to the group home and she will come see patient today at [facility]. CSW will continue to follow and assist as needed.   Employment status:  Disabled (Comment on whether or not currently receiving Disability) Insurance information:  Managed Care, [place] PT Recommendations:  Not assessed at this time Information / Referral to community resources:  Other (Comment Required)(Patient's parents are requesting that patient retrun to [facility]. )  Patient/Family's Response to care:  Patient's parents prefer for patient to return to the group home.   Patient/Family's Understanding of and Emotional Response to Diagnosis, Current Treatment, and Prognosis:  Patient's parents were very pleasant and thanked CSW for assistance.   Emotional Assessment Appearance:  Appears stated age Attitude/Demeanor/Rapport:  Unable to Assess Affect (typically observed):  Unable to Assess Orientation:  Oriented to Self, Oriented to Place, Oriented to  Time, Fluctuating Orientation (Suspected and/or reported Sundowners) Alcohol / Substance use:  Not Applicable Psych involvement (Current and /or in the community):  No (Comment)  Discharge Needs  Concerns to be addressed:  Discharge Planning Concerns Readmission within the last 30 days:  No Current discharge risk:  Dependent with Mobility Barriers to Discharge:  Continued Medical Work up   [employer], Angelica Beets, LCSW 10/18/2017, 5:07 PM

## 2017-10-18 NOTE — NC FL2 (Signed)
[place] MEDICAID FL2 LEVEL OF CARE SCREENING TOOL     IDENTIFICATION  Patient Name: Angelica [facility] Birthdate: 01/03/1996 Sex: female Admission Date (Current Location): 10/18/2017  [place]  [place] ([id number] [facility]) Facility and Address:  [facility], [address], [place], [place] [address]      Provider Number: [id number]  Attending Physician Name and Address:  Juanell FairlyKrasinski, Kevin, MD  Relative Name and Phone Number:       Current Level of Care: Hospital Recommended Level of Care: Other (Comment)([facility]. ) Prior Approval Number:    Date Approved/Denied:   PASRR Number:    Discharge Plan: Domiciliary (Rest home)    Current Diagnoses: Patient Active Problem List   Diagnosis Date Noted  . Bimalleolar ankle fracture, left, closed, initial encounter 10/18/2017  . Intermittent explosive disorder 10/13/2016  . Speech delay 10/13/2016  . Encopresis 10/13/2016  . Dysmenorrhea treated with oral contraceptive 10/13/2016  . Closed fracture of bone of left foot 09/08/2016  . Partial epilepsy with impairment of consciousness, intractable (HCC)   . Attention deficit disorder (ADD) without hyperactivity   . Static encephalopathy     Orientation RESPIRATION BLADDER Height & Weight     Self, Time, Situation, Place  Normal Continent Weight: 172 lb (78 kg) Height:  5\' 3"  (160 cm)  BEHAVIORAL SYMPTOMS/MOOD NEUROLOGICAL BOWEL NUTRITION STATUS      Continent Diet(Regular Diet. )  AMBULATORY STATUS COMMUNICATION OF NEEDS Skin   Limited Assist Verbally Surgical wounds(Incision: Left Ankle. )                       Personal Care Assistance Level of Assistance  Bathing, Feeding, Dressing Bathing Assistance: Limited assistance Feeding assistance: Independent Dressing Assistance: Limited assistance     Functional Limitations Info  Sight, Hearing, Speech Sight Info: Adequate Hearing Info: Adequate Speech Info:  Adequate    SPECIAL CARE FACTORS FREQUENCY  PT (By licensed PT)     PT Frequency: (PT to evaluate and treat. )              Contractures      Additional Factors Info  Code Status, Allergies Code Status Info: (Full Code. ) Allergies Info: (Influenza Vaccines)           Current Medications (10/18/2017):  This is the current hospital active medication list Current Facility-Administered Medications  Medication Dose Route Frequency Provider Last Rate Last Dose  . 0.9 %  sodium chloride infusion  75 mL/hr Intravenous Continuous Juanell FairlyKrasinski, Kevin, MD 75 mL/hr at 10/18/17 1543 75 mL/hr at 10/18/17 1543  . acetaminophen (TYLENOL) tablet 650 mg  650 mg Oral Q6H PRN Juanell FairlyKrasinski, Kevin, MD       Or  . acetaminophen (TYLENOL) suppository 650 mg  650 mg Rectal Q6H PRN Juanell FairlyKrasinski, Kevin, MD      . alum & mag hydroxide-simeth (MAALOX/MYLANTA) 200-200-20 MG/5ML suspension 30 mL  30 mL Oral Q4H PRN Juanell FairlyKrasinski, Kevin, MD      . [name][START ON 10/19/2017] aspirin EC tablet 325 mg  325 mg Oral Q breakfast Juanell FairlyKrasinski, Kevin, MD      . bisacodyl (DULCOLAX) suppository 10 mg  10 mg Rectal Daily PRN Juanell FairlyKrasinski, Kevin, MD      . [name][START ON 10/19/2017] calcium-vitamin D (OSCAL WITH D) 500-200 MG-UNIT per tablet 1 tablet  1 tablet Oral Daily Juanell FairlyKrasinski, Kevin, MD      . carbamazepine (TEGRETOL XR) 12 hr tablet 200 mg  200 mg Oral  BID Juanell Fairly, MD      . carbamazepine (TEGRETOL XR) 12 hr tablet 300 mg  300 mg Oral BID Juanell Fairly, MD      . ceFAZolin (ANCEF) IVPB 2g/100 mL premix  2 g Intravenous Q6H Juanell Fairly, MD   Stopped at 10/18/17 1613  . [START ON 10/19/2017] docusate sodium (COLACE) capsule 100 mg  100 mg Oral q morning - 10a Juanell Fairly, MD      . guaifenesin (ROBITUSSIN) 100 MG/5ML syrup 100 mg  100 mg Oral Q4H PRN Juanell Fairly, MD      . lactulose (CHRONULAC) 10 GM/15ML solution 13.3333 g  13.3333 g Oral QPM Juanell Fairly, MD      . lamoTRIgine (LAMICTAL) tablet 300 mg  300 mg  Oral BID Juanell Fairly, MD      . LORazepam (ATIVAN) tablet 1.5 mg  1.5 mg Oral Q8H PRN Juanell Fairly, MD      . magnesium citrate solution 1 Bottle  1 Bottle Oral Once PRN Juanell Fairly, MD      . [name] ON 10/19/2017] magnesium oxide (MAG-OX) tablet 400 mg  400 mg Oral Q0600 Juanell Fairly, MD      . menthol-cetylpyridinium (CEPACOL) lozenge 3 mg  1 lozenge Oral PRN Juanell Fairly, MD       Or  . phenol (CHLORASEPTIC) mouth spray 1 spray  1 spray Mouth/Throat PRN Juanell Fairly, MD      . methocarbamol (ROBAXIN) tablet 500 mg  500 mg Oral Q6H PRN Juanell Fairly, MD       Or  . methocarbamol (ROBAXIN) 500 mg in dextrose 5 % 50 mL IVPB  500 mg Intravenous Q6H PRN Juanell Fairly, MD      . midazolam (VERSED) 5 MG/5ML injection 5 mg  5 mg Nasal See admin instructions Juanell Fairly, MD      . morphine 2 MG/ML injection 2 mg  2 mg Intravenous Q2H PRN Juanell Fairly, MD      . multivitamin with minerals tablet 1 tablet  1 tablet Oral QPM Juanell Fairly, MD      . Norethindrone-Ethinyl Estradiol-Fe Biphas (LO LOESTRIN FE) 1 MG-10 MCG / 10 MCG tablet 1 tablet  1 tablet Oral Daily Juanell Fairly, MD      . omega-3 acid ethyl esters (LOVAZA) capsule 1 g  1 g Oral Daily Juanell Fairly, MD      . ondansetron [facility]) tablet 4 mg  4 mg Oral Q6H PRN Juanell Fairly, MD       Or  . ondansetron [facility]) injection 4 mg  4 mg Intravenous Q6H PRN Juanell Fairly, MD      . oxyCODONE (Oxy IR/ROXICODONE) immediate release tablet 5-10 mg  5-10 mg Oral Q4H PRN Juanell Fairly, MD      . polyethylene glycol (MIRALAX / GLYCOLAX) packet 17 g  17 g Oral Daily PRN Juanell Fairly, MD      . risperiDONE (RISPERDAL) tablet 1 mg  1 mg Oral BID Juanell Fairly, MD      . senna [name]) tablet 8.6 mg  1 tablet Oral BID Juanell Fairly, MD         Discharge Medications: Please see discharge summary for a list of discharge medications.  Relevant Imaging Results:  Relevant Lab  Results:   Additional Information (SSN: [id number])  Sahiti Joswick, Darleen Crocker, LCSW

## 2017-10-19 ENCOUNTER — Telehealth: Payer: Self-pay | Admitting: Family Medicine

## 2017-10-19 DIAGNOSIS — S82843A Displaced bimalleolar fracture of unspecified lower leg, initial encounter for closed fracture: Secondary | ICD-10-CM | POA: Diagnosis not present

## 2017-10-19 DIAGNOSIS — G9349 Other encephalopathy: Secondary | ICD-10-CM | POA: Diagnosis not present

## 2017-10-19 DIAGNOSIS — G40802 Other epilepsy, not intractable, without status epilepticus: Secondary | ICD-10-CM | POA: Diagnosis not present

## 2017-10-19 DIAGNOSIS — S82842A Displaced bimalleolar fracture of left lower leg, initial encounter for closed fracture: Secondary | ICD-10-CM | POA: Diagnosis not present

## 2017-10-19 DIAGNOSIS — Z887 Allergy status to serum and vaccine status: Secondary | ICD-10-CM | POA: Diagnosis not present

## 2017-10-19 DIAGNOSIS — W19XXXA Unspecified fall, initial encounter: Secondary | ICD-10-CM | POA: Diagnosis not present

## 2017-10-19 DIAGNOSIS — K5900 Constipation, unspecified: Secondary | ICD-10-CM | POA: Diagnosis not present

## 2017-10-19 DIAGNOSIS — F988 Other specified behavioral and emotional disorders with onset usually occurring in childhood and adolescence: Secondary | ICD-10-CM | POA: Diagnosis not present

## 2017-10-19 DIAGNOSIS — Z79899 Other long term (current) drug therapy: Secondary | ICD-10-CM | POA: Diagnosis not present

## 2017-10-19 DIAGNOSIS — R479 Unspecified speech disturbances: Secondary | ICD-10-CM | POA: Diagnosis not present

## 2017-10-19 DIAGNOSIS — R000 Tachycardia, unspecified: Secondary | ICD-10-CM | POA: Diagnosis not present

## 2017-10-19 DIAGNOSIS — Y939 Activity, unspecified: Secondary | ICD-10-CM | POA: Diagnosis not present

## 2017-10-19 LAB — BASIC METABOLIC PANEL
Anion gap: 8 (ref 5–15)
BUN: 6 mg/dL (ref 6–20)
CHLORIDE: 105 mmol/L (ref 101–111)
CO2: 26 mmol/L (ref 22–32)
CREATININE: 0.61 mg/dL (ref 0.44–1.00)
Calcium: 8.7 mg/dL — ABNORMAL LOW (ref 8.9–10.3)
GFR calc Af Amer: >60 mL/min (ref >60)
GFR calc non Af Amer: >60 mL/min (ref >60)
Glucose, Bld: 124 mg/dL — ABNORMAL HIGH (ref 65–99)
Potassium: 3.6 mmol/L (ref 3.5–5.1)
SODIUM: 139 mmol/L (ref 135–145)

## 2017-10-19 LAB — CBC
HCT: 35.2 % (ref 35.0–47.0)
HEMOGLOBIN: 12.1 g/dL (ref 12.0–16.0)
MCH: 30.9 pg (ref 26.0–34.0)
MCHC: 34.5 g/dL (ref 32.0–36.0)
MCV: 89.7 fL (ref 80.0–100.0)
Platelets: 282 10*3/uL (ref 150–440)
RBC: 3.93 MIL/uL (ref 3.80–5.20)
RDW: 12.1 % (ref 11.5–14.5)
WBC: 10.1 10*3/uL (ref 3.6–11.0)

## 2017-10-19 MED ORDER — DILTIAZEM HCL 30 MG PO TABS
30.0000 mg | ORAL_TABLET | Freq: Four times a day (QID) | ORAL | Status: DC | PRN
  Administered 2017-10-19: 30 mg via ORAL
  Filled 2017-10-19: qty 1

## 2017-10-19 MED ORDER — DILTIAZEM HCL 30 MG PO TABS
60.0000 mg | ORAL_TABLET | Freq: Four times a day (QID) | ORAL | Status: DC | PRN
  Administered 2017-10-19: 60 mg via ORAL
  Filled 2017-10-19: qty 2

## 2017-10-19 MED ORDER — METOPROLOL TARTRATE 25 MG PO TABS
12.5000 mg | ORAL_TABLET | Freq: Two times a day (BID) | ORAL | Status: DC
  Administered 2017-10-19 (×2): 12.5 mg via ORAL
  Filled 2017-10-19 (×3): qty 1

## 2017-10-19 MED ORDER — HYDROCODONE-ACETAMINOPHEN 5-325 MG PO TABS
1.0000 | ORAL_TABLET | ORAL | Status: DC | PRN
  Administered 2017-10-19 – 2017-10-20 (×4): 1 via ORAL
  Filled 2017-10-19 (×4): qty 1

## 2017-10-19 MED ORDER — SODIUM CHLORIDE 0.9 % IV SOLN
INTRAVENOUS | Status: DC
  Administered 2017-10-19 – 2017-10-20 (×2): via INTRAVENOUS

## 2017-10-19 MED ORDER — ASPIRIN EC 325 MG PO TBEC
325.0000 mg | DELAYED_RELEASE_TABLET | Freq: Two times a day (BID) | ORAL | Status: DC
  Administered 2017-10-19 – 2017-10-20 (×2): 325 mg via ORAL
  Filled 2017-10-19 (×2): qty 1

## 2017-10-19 MED ORDER — METOPROLOL TARTRATE 25 MG PO TABS
12.5000 mg | ORAL_TABLET | Freq: Once | ORAL | Status: AC
  Administered 2017-10-19: 12.5 mg via ORAL
  Filled 2017-10-19: qty 1

## 2017-10-19 MED ORDER — SODIUM CHLORIDE 0.9 % IV BOLUS (SEPSIS)
1000.0000 mL | Freq: Once | INTRAVENOUS | Status: AC
  Administered 2017-10-19: 1000 mL via INTRAVENOUS

## 2017-10-19 NOTE — Telephone Encounter (Signed)
FL2 just filled out. Please find out from home how we can get this discontinued. I've discontinued it in the chart.

## 2017-10-19 NOTE — Progress Notes (Signed)
Continues to be tachycardic the whole day today heart rate between 120s-130s.  Heart rate did not improve with beta-blocker, started calcium channel blocker, we will check Tsh.also patient received multiple fluid boluses , increased per day rate of maintenance fluids.

## 2017-10-19 NOTE — Progress Notes (Signed)
  Subjective:  POD #1 s/p ORIF left bimalleolar ankle fracture. Patient is sleeping. Her parents are at the bedside. Patient is reported to be having marked left ankle pain overnight.   Patient is having postop tachycardia. The hospital's have been consulted to assist with management of the tachycardia. Patient has received fluid boluses and metoprolol.    Objective:   VITALS:   Vitals:   10/19/17 0437 10/19/17 0540 10/19/17 0736 10/19/17 1225  BP:  116/70 107/64 107/61  Pulse: (!) 116 (!) 119 (!) 117 (!) 132  Resp:  18 20 16   Temp:  98.9 F (37.2 C) 98.7 F (37.1 C) 98 F (36.7 C)  TempSrc:  Oral Oral Oral  SpO2:  97% 97% 96%  Weight:      Height:        PHYSICAL EXAM: Left lower extremity:  Patient was not awoken for exam. Her dressing is clean and dry. Her toes are well-perfused.   LABS  Results for orders placed or performed during the hospital encounter of 10/18/17 (from the past 24 hour(s))  CBC     Status: None   Collection Time: 10/19/17  6:31 AM  Result Value Ref Range   WBC 10.1 3.6 - 11.0 K/uL   RBC 3.93 3.80 - 5.20 MIL/uL   Hemoglobin 12.1 12.0 - 16.0 g/dL   HCT 54.035.2 98.135.0 - 19.147.0 %   MCV 89.7 80.0 - 100.0 fL   MCH 30.9 26.0 - 34.0 pg   MCHC 34.5 32.0 - 36.0 g/dL   RDW 47.812.1 29.511.5 - 62.114.5 %   Platelets 282 150 - 440 K/uL  Basic metabolic panel     Status: Abnormal   Collection Time: 10/19/17  6:31 AM  Result Value Ref Range   Sodium 139 135 - 145 mmol/L   Potassium 3.6 3.5 - 5.1 mmol/L   Chloride 105 101 - 111 mmol/L   CO2 26 22 - 32 mmol/L   Glucose, Bld 124 (H) 65 - 99 mg/dL   BUN 6 6 - 20 mg/dL   Creatinine, Ser 3.080.61 0.44 - 1.00 mg/dL   Calcium 8.7 (L) 8.9 - 10.3 mg/dL   GFR calc non Af Amer >60 >60 mL/min   GFR calc Af Amer >60 >60 mL/min   Anion gap 8 5 - 15    Dg Ankle Left Port  Result Date: 10/18/2017 CLINICAL DATA:  Status post ORIF for bimalleolar left ankle fracture. EXAM: PORTABLE LEFT ANKLE - 2 VIEW COMPARISON:  Preoperative study of  June 12, 2017 FINDINGS: The patient has undergone plate and screw fixation of the mildly displaced distal fibular fracture. Alignment is near anatomic. Two screws been placed through the medial malleolar fracture with reduction of same. IMPRESSION: No immediate postprocedure complication following plate and screw fixation of a bimalleolar fracture. Electronically Signed   By: David  SwazilandJordan M.D.   On: 10/18/2017 13:36    Assessment/Plan: 1 Day Post-Op   Active Problems:   Bimalleolar ankle fracture, left, closed, initial encounter  Continue current pain management.  Metoprolol per hospitalist. Continue fluid support.  Physical therapy as patient can tolerate.  Patient ideally is NWB on the left lower extremity.  Patient may be discharged once heart rate is controlled.   Appreciate hospitalist consult.    Juanell FairlyKRASINSKI, Matia Zelada , MD 10/19/2017, 2:16 PM

## 2017-10-19 NOTE — Telephone Encounter (Signed)
Routing to provider  

## 2017-10-19 NOTE — Progress Notes (Signed)
OT Note  Patient Details Name: Angelica [facility] MRN: [id number] DOB: 01-08-1996   Cancelled Treatment:    Reason Eval/Treat Not Completed: Pt. was screened for OT services. Pt. With elevated HR per telemetry. Spoke with pt.'s father about OT services. [facility]. Pt. was previously receiving assistance for ADLs, and IADLs prior to hospitalization from [facility]. Pt.'s father reported that they plan to have pt. return to the group home upon discharge, and plan tohave the level of care that she needs resume. Anticipate that the pt. Will continue to require assistance with ADLs, and IADL upon discharge. Upon discussion with the pt.'s father, it was determined that OT services are not warranted at this time. Will rescreen as appropriate, or upon request.  Olegario MessierElaine Ashlye Oviedo, MS, OTR/L 10/19/2017, 10:56 AM

## 2017-10-19 NOTE — Telephone Encounter (Signed)
Nurse notified to remove inhaler off of patients chart. Will send [facility] to [facility].

## 2017-10-19 NOTE — Progress Notes (Signed)
Medical consult follow-up for tachycardia. Still tachycardic with heart rate 117 bpm. The patient complains of left ankle pain, also decreased fluid intake and father at bedside. Patient says that he had ankle pain, physical therapy on hard because of tachycardia.  Physical examination: Patient alert, awake, oriented.,  Mental slowing observed. Cardiovascular system ;S1, S2 exam, tachycardic. Lungs: Clear to auscultation, no wheeze, no areas. Musculoskeletal the patient has left ankle immobilizer.  Neurologically patient has developmental delay, but answers questions appropriately. Assessment and plan:  Bimalleolar ankle fracture, left, closed, initial encounter - s/p ORIF by orthopedic surgery; patient pain was not well controlled as per patient's father.  At this time patient is on Robaxin, morphine 2 mg every 2 hours.  Add Norco as well. 2.  Sinus tachycardia secondary to dehydration, due to pain as well.  Continue aggressive hydration..Patient's father mentioned that she is not drinking enough water.  Her mucosa appears dry.  I ordered a liter of fluid bolus.  Also received small dose of metoprolol 12.5 mg this morning. Add metoprolol 25 mg bid/ #3 history of partial seizures, developmental delay, patient is from group home, she is on Lamictal, Tegretol-XR, [facility], [facility].  Patient also is on midazolam nasal spray, Ativan for breakthrough seizures. #4/ history of ADHD.  Patient is on/Risperdal 1 mg twice daily. Spoke with patient's father. Time spent 30 minutes.

## 2017-10-19 NOTE — Progress Notes (Signed)
PT Cancellation Note  Patient Details Name: Angelica BrashLauren Duerst MRN: [id number] DOB: 1996/09/27   Cancelled Treatment:    Reason Eval/Treat Not Completed: Medical issues which prohibited therapy.  PT consult received.  Chart reviewed.  Discussed pt with nursing who reports pt's HR currently elevated.  On telemetry, pt's HR 140-149 bpm currently.  Pt currently not appropriate for PT evaluation d/t elevated HR.  Will re-attempt PT evaluation at a later date/time as medically appropriate.  Hendricks LimesEmily Lesley Atkin, PT 10/19/17, 9:15 AM [phone]

## 2017-10-19 NOTE — Progress Notes (Addendum)
Per MD patient will likely be stable for D/C back to [facility] tomorrow. Clinical Child psychotherapistocial Worker (CSW) left [facility] nurse Lupita LeashDonna a voicemail making her aware of above.   Lupita LeashDonna called CSW back and stated that [facility] can [facility] and they will transport. Lupita LeashDonna also requested that her inhaler be stopped because patient's father requested that. MD aware of above.   [employer]Bailey Glendola Friedhoff, LCSW [phone]

## 2017-10-19 NOTE — Telephone Encounter (Signed)
Copied from CRM [id number]. Topic: General - Other >> Oct 19, 2017 10:45 AM Lelon FrohlichGolden, Tashia, RMA wrote: Reason for CRM: Her father would like for her inhaler albuterol to be d/c'ed because pt does not use it  Please contact [phone]

## 2017-10-19 NOTE — Progress Notes (Signed)
Notified Dr Anne HahnWillis of heart rate sustaining in 130's. He will place order for oral beta blocker

## 2017-10-19 NOTE — Progress Notes (Signed)
PT Cancellation Note  Patient Details Name: Angelica [facility] MRN: [id number] DOB: February 05, 1996   Cancelled Treatment:    Reason Eval/Treat Not Completed: Other (comment).  Nursing reports pt's HR recently decreased (currently 108-110 bpm at rest).  Discussed pt with MD Martha ClanKrasinski who recommended letting pt rest today (monitoring HR overnight) and attempting PT eval tomorrow as medically appropriate.  Nursing notified.  SW reports pt has really good support at group home and plan is for pt to discharge to group home with PT.  Hendricks LimesEmily Nakyah Erdmann, PT 10/19/17, 2:33 PM [phone]

## 2017-10-20 DIAGNOSIS — G40802 Other epilepsy, not intractable, without status epilepticus: Secondary | ICD-10-CM | POA: Diagnosis not present

## 2017-10-20 DIAGNOSIS — Z79899 Other long term (current) drug therapy: Secondary | ICD-10-CM | POA: Diagnosis not present

## 2017-10-20 DIAGNOSIS — F988 Other specified behavioral and emotional disorders with onset usually occurring in childhood and adolescence: Secondary | ICD-10-CM | POA: Diagnosis not present

## 2017-10-20 DIAGNOSIS — K5900 Constipation, unspecified: Secondary | ICD-10-CM | POA: Diagnosis not present

## 2017-10-20 DIAGNOSIS — R479 Unspecified speech disturbances: Secondary | ICD-10-CM | POA: Diagnosis not present

## 2017-10-20 DIAGNOSIS — W19XXXA Unspecified fall, initial encounter: Secondary | ICD-10-CM | POA: Diagnosis not present

## 2017-10-20 DIAGNOSIS — Z887 Allergy status to serum and vaccine status: Secondary | ICD-10-CM | POA: Diagnosis not present

## 2017-10-20 DIAGNOSIS — S82842A Displaced bimalleolar fracture of left lower leg, initial encounter for closed fracture: Secondary | ICD-10-CM | POA: Diagnosis not present

## 2017-10-20 DIAGNOSIS — G9349 Other encephalopathy: Secondary | ICD-10-CM | POA: Diagnosis not present

## 2017-10-20 DIAGNOSIS — Y939 Activity, unspecified: Secondary | ICD-10-CM | POA: Diagnosis not present

## 2017-10-20 LAB — BASIC METABOLIC PANEL
Anion gap: 8 (ref 5–15)
BUN: 7 mg/dL (ref 6–20)
CO2: 24 mmol/L (ref 22–32)
CREATININE: 0.65 mg/dL (ref 0.44–1.00)
Calcium: 8.6 mg/dL — ABNORMAL LOW (ref 8.9–10.3)
Chloride: 107 mmol/L (ref 101–111)
GFR calc Af Amer: >60 mL/min (ref >60)
GFR calc non Af Amer: >60 mL/min (ref >60)
GLUCOSE: 106 mg/dL — AB (ref 65–99)
Potassium: 3.6 mmol/L (ref 3.5–5.1)
Sodium: 139 mmol/L (ref 135–145)

## 2017-10-20 LAB — CBC
HEMATOCRIT: 35.1 % (ref 35.0–47.0)
Hemoglobin: 11.8 g/dL — ABNORMAL LOW (ref 12.0–16.0)
MCH: 30.6 pg (ref 26.0–34.0)
MCHC: 33.6 g/dL (ref 32.0–36.0)
MCV: 91 fL (ref 80.0–100.0)
Platelets: 290 10*3/uL (ref 150–440)
RBC: 3.86 MIL/uL (ref 3.80–5.20)
RDW: 12.3 % (ref 11.5–14.5)
WBC: 11.8 10*3/uL — ABNORMAL HIGH (ref 3.6–11.0)

## 2017-10-20 MED ORDER — METHOCARBAMOL 500 MG PO TABS: 500.0000 mg | ORAL_TABLET | Freq: Four times a day (QID) | ORAL | 0 refills | Status: DC | PRN

## 2017-10-20 MED ORDER — ASPIRIN 325 MG PO TBEC: 325.0000 mg | DELAYED_RELEASE_TABLET | Freq: Two times a day (BID) | ORAL | 0 refills | Status: DC

## 2017-10-20 MED ORDER — BISACODYL 10 MG RE SUPP: 10.0000 mg | Freq: Every day | RECTAL | 0 refills | Status: DC | PRN

## 2017-10-20 MED ORDER — MAGNESIUM OXIDE -MG SUPPLEMENT 200 MG PO TABS: 1.0000 | ORAL_TABLET | Freq: Every day | ORAL | 0 refills | Status: DC

## 2017-10-20 MED ORDER — DILTIAZEM HCL 30 MG PO TABS: 30.0000 mg | ORAL_TABLET | Freq: Four times a day (QID) | ORAL | 0 refills | Status: DC

## 2017-10-20 MED ORDER — HYDROCODONE-ACETAMINOPHEN 5-325 MG PO TABS: 1.0000 | ORAL_TABLET | ORAL | 0 refills | Status: DC | PRN

## 2017-10-20 MED ORDER — MAGNESIUM OXIDE -MG SUPPLEMENT 200 MG PO TABS: 1.0000 | ORAL_TABLET | Freq: Every day | ORAL | 0 refills | Status: AC

## 2017-10-20 MED ORDER — CALCIUM-VITAMIN D 500-400 MG-UNIT PO TABS: 1.0000 | ORAL_TABLET | Freq: Every day | ORAL | 0 refills | Status: DC

## 2017-10-20 MED ORDER — CALCIUM-VITAMIN D 500-400 MG-UNIT PO TABS: 1.0000 | ORAL_TABLET | Freq: Every day | ORAL | 0 refills | Status: AC

## 2017-10-20 MED ORDER — DILTIAZEM HCL 30 MG PO TABS
30.0000 mg | ORAL_TABLET | Freq: Four times a day (QID) | ORAL | Status: DC
  Administered 2017-10-20: 30 mg via ORAL
  Filled 2017-10-20: qty 1

## 2017-10-20 NOTE — Care Management Note (Signed)
Case Management Note  Patient Details  Name: Marcia BrashLauren Market MRN: [id number] Date of Birth: 24-Dec-1995  Subjective/Objective:    Per weekend CSW, Ms Marijo ConceptionBraswell will return to [facility] where PT services in house will be provided. Wheelchair bound at this time.                 Action/Plan:   Expected Discharge Date:  10/20/17               Expected Discharge Plan:     In-House Referral:     Discharge planning Services     Post Acute Care Choice:    Choice offered to:     DME Arranged:    DME Agency:     HH Arranged:    HH Agency:     Status of Service:     If discussed at [employer] Meetings, dates discussed:    Additional Comments:  Ayden Apodaca A, RN 10/20/2017, 2:51 PM

## 2017-10-20 NOTE — [place] FL2 (Addendum)
[place] MEDICAID FL2 LEVEL OF CARE SCREENING TOOL     IDENTIFICATION  Patient Name: Angelica BrashLauren Micheals Birthdate: [birth year] Sex: female Admission Date (Current Location): 10/18/2017  [place]  [place] ([id number] [facility]) Facility and Address:  [facility], [address], [place], [place] [address]      Provider Number: [id number]  Attending Physician Name and Address:  Juanell FairlyKrasinski, Kevin, MD  Relative Name and Phone Number:       Current Level of Care: Hospital Recommended Level of Care: Other (Comment)([name]. ) Prior Approval Number:    Date Approved/Denied:   PASRR Number:    Discharge Plan: Domiciliary (Rest home)    Current Diagnoses: Patient Active Problem List   Diagnosis Date Noted  . Bimalleolar ankle fracture, left, closed, initial encounter 10/18/2017  . Intermittent explosive disorder 10/13/2016  . Speech delay 10/13/2016  . Encopresis 10/13/2016  . Dysmenorrhea treated with oral contraceptive 10/13/2016  . Closed fracture of bone of left foot 09/08/2016  . Partial epilepsy with impairment of consciousness, intractable (HCC)   . Attention deficit disorder (ADD) without hyperactivity   . Static encephalopathy     Orientation RESPIRATION BLADDER Height & Weight     Self, Time, Situation, Place  Normal Continent Weight: 172 lb (78 kg) Height:  5\' 3"  (160 cm)  BEHAVIORAL SYMPTOMS/MOOD NEUROLOGICAL BOWEL NUTRITION STATUS      Continent Diet(Regular Diet. )  AMBULATORY STATUS COMMUNICATION OF NEEDS Skin   Limited Assist Verbally Surgical wounds(Incision: Left Ankle. )                       Personal Care Assistance Level of Assistance  Bathing, Feeding, Dressing Bathing Assistance: Limited assistance Feeding assistance: Independent Dressing Assistance: Limited assistance     Functional Limitations Info  Sight, Hearing, Speech Sight Info: Adequate Hearing Info: Adequate Speech Info:  Adequate    SPECIAL CARE FACTORS FREQUENCY  PT (By licensed PT)     PT Frequency: (PT to evaluate and treat. )              Contractures      Additional Factors Info  Code Status, Allergies Code Status Info: (Full Code. ) Allergies Info: (Influenza Vaccines)           Current Medications (10/20/2017):  This is the current hospital active medication list Current Facility-Administered Medications  Medication Dose Route Frequency Provider Last Rate Last Dose  . 0.9 %  sodium chloride infusion   Intravenous Continuous Katha HammingKonidena, Snehalatha, MD 100 mL/hr at 10/20/17 0625    . acetaminophen (TYLENOL) tablet 650 mg  650 mg Oral Q6H PRN Juanell FairlyKrasinski, Kevin, MD   650 mg at 10/20/17 0805   Or  . acetaminophen (TYLENOL) suppository 650 mg  650 mg Rectal Q6H PRN Juanell FairlyKrasinski, Kevin, MD      . alum & mag hydroxide-simeth (MAALOX/MYLANTA) 200-200-20 MG/5ML suspension 30 mL  30 mL Oral Q4H PRN Juanell FairlyKrasinski, Kevin, MD      . aspirin EC tablet 325 mg  325 mg Oral BID Juanell FairlyKrasinski, Kevin, MD   325 mg at 10/20/17 0949  . bisacodyl (DULCOLAX) suppository 10 mg  10 mg Rectal Daily PRN Juanell FairlyKrasinski, Kevin, MD      . calcium-vitamin D (OSCAL WITH D) 500-200 MG-UNIT per tablet 1 tablet  1 tablet Oral Daily Juanell FairlyKrasinski, Kevin, MD   1 tablet at 10/20/17 0949  . carbamazepine (TEGRETOL XR) 12 hr tablet 200 mg  200 mg Oral BID Martha ClanKrasinski,  Caryn Bee, MD   200 mg at 10/20/17 0805  . carbamazepine (TEGRETOL XR) 12 hr tablet 300 mg  300 mg Oral BID Juanell Fairly, MD   300 mg at 10/20/17 0805  . diltiazem (CARDIZEM) tablet 30 mg  30 mg Oral Q6H Katha Hamming, MD   30 mg at 10/20/17 1006  . diltiazem (CARDIZEM) tablet 60 mg  60 mg Oral Q6H PRN Katha Hamming, MD   60 mg at 10/19/17 1743  . docusate sodium (COLACE) capsule 100 mg  100 mg Oral q morning - 10a Juanell Fairly, MD   100 mg at 10/20/17 0949  . guaifenesin (ROBITUSSIN) 100 MG/5ML syrup 100 mg  100 mg Oral Q4H PRN Juanell Fairly, MD      .  HYDROcodone-acetaminophen (NORCO/VICODIN) 5-325 MG per tablet 1 tablet  1 tablet Oral Q4H PRN Katha Hamming, MD   1 tablet at 10/20/17 0646  . lactulose (CHRONULAC) 10 GM/15ML solution 13.3333 g  13.3333 g Oral QPM Juanell Fairly, MD   13.3333 g at 10/19/17 1717  . lamoTRIgine (LAMICTAL) tablet 300 mg  300 mg Oral BID Juanell Fairly, MD   300 mg at 10/20/17 0805  . lip balm (BLISTEX) ointment   Topical PRN Juanell Fairly, MD      . LORazepam (ATIVAN) tablet 1.5 mg  1.5 mg Oral Q8H PRN Juanell Fairly, MD      . magnesium citrate solution 1 Bottle  1 Bottle Oral Once PRN Juanell Fairly, MD      . magnesium oxide (MAG-OX) tablet 400 mg  400 mg Oral Q0600 Juanell Fairly, MD   400 mg at 10/20/17 0646  . menthol-cetylpyridinium (CEPACOL) lozenge 3 mg  1 lozenge Oral PRN Juanell Fairly, MD       Or  . phenol (CHLORASEPTIC) mouth spray 1 spray  1 spray Mouth/Throat PRN Juanell Fairly, MD      . methocarbamol (ROBAXIN) tablet 500 mg  500 mg Oral Q6H PRN Juanell Fairly, MD       Or  . methocarbamol (ROBAXIN) 500 mg in dextrose 5 % 50 mL IVPB  500 mg Intravenous Q6H PRN Juanell Fairly, MD      . metoprolol tartrate (LOPRESSOR) tablet 12.5 mg  12.5 mg Oral BID Katha Hamming, MD   Stopped at 10/20/17 [phone]  . midazolam (VERSED) 5 MG/5ML injection 5 mg  5 mg Nasal See admin instructions Juanell Fairly, MD      . morphine 2 MG/ML injection 2 mg  2 mg Intravenous Q2H PRN Juanell Fairly, MD   2 mg at 10/18/17 2233  . multivitamin with minerals tablet 1 tablet  1 tablet Oral QPM Juanell Fairly, MD   1 tablet at 10/19/17 1715  . Norethindrone-Ethinyl Estradiol-Fe Biphas (LO LOESTRIN FE) 1 MG-10 MCG / 10 MCG tablet 1 tablet  1 tablet Oral Daily Juanell Fairly, MD      . omega-3 acid ethyl esters (LOVAZA) capsule 1 g  1 g Oral Daily Juanell Fairly, MD   1 g at 10/20/17 0949  . ondansetron (ZOFRAN) tablet 4 mg  4 mg Oral Q6H PRN Juanell Fairly, MD       Or  . ondansetron  [facility]) injection 4 mg  4 mg Intravenous Q6H PRN Juanell Fairly, MD      . oxyCODONE (Oxy IR/ROXICODONE) immediate release tablet 5-10 mg  5-10 mg Oral Q4H PRN Juanell Fairly, MD   5 mg at 10/19/17 1959  . polyethylene glycol (MIRALAX / GLYCOLAX) packet 17 g  17 g  Oral Daily PRN Juanell Fairly, MD      . risperiDONE (RISPERDAL) tablet 1 mg  1 mg Oral BID Juanell Fairly, MD   1 mg at 10/20/17 0805  . senna (SENOKOT) tablet 8.6 mg  1 tablet Oral BID Juanell Fairly, MD   8.6 mg at 10/20/17 0949     STOP taking these medications           calcium-vitamin D 500-200 MG-UNIT tablet Replaced by:  Calcium-Vitamin D 500-400 MG-UNIT Tabs    Magnesium Oxide 250 MG Tabs                       TAKE these medications            acetaminophen 160 MG/5ML liquid Commonly known as:  TYLENOL Take 20 mLs (640 mg total) by mouth every 6 (six) hours as needed for fever or pain.    aspirin 325 MG EC tablet Take 1 tablet (325 mg total) by mouth 2 (two) times daily.    ATIVAN 1 MG tablet Generic drug:  LORazepam Take 1.5 mg by mouth every 8 (eight) hours as needed for seizure (after any cluster of seizures, or prolonged seizure,).    bisacodyl 10 MG suppository Commonly known as:  DULCOLAX Place 1 suppository (10 mg total) rectally daily as needed for moderate constipation.    Calcium-Vitamin D 500-400 MG-UNIT Tabs Take 1 tablet by mouth daily. Replaces:  calcium-vitamin D 500-200 MG-UNIT tablet    carbamazepine 300 MG 12 hr capsule Commonly known as:  CARBATROL Take 300 mg by mouth 2 (two) times daily. (0700 &1900)    carbamazepine 200 MG 12 hr capsule Commonly known as:  CARBATROL Take 200 mg by mouth 2 (two) times daily. (0700 & 1900)    diltiazem 30 MG tablet Commonly known as:  CARDIZEM Take 1 tablet (30 mg total) by mouth every 6 (six) hours. Hold medication for systolic pressure less than 90 and/or diastolic pressure less than 60 Use medication if heart rate is  greater than 110    guaifenesin 100 MG/5ML syrup Commonly known as:  ROBITUSSIN Take 100 mg by mouth every 4 (four) hours as needed for cough.    HYDROcodone-acetaminophen 5-325 MG tablet Commonly known as:  NORCO/VICODIN Take 1 tablet by mouth every 4 (four) hours as needed for moderate pain or severe pain.    lactulose 10 GM/15ML solution Commonly known as:  CHRONULAC Take 13.3333 g by mouth every evening. (1900)Take 20 mls by mouth every evening    lamoTRIgine 150 MG tablet Commonly known as:  LAMICTAL Take 300 mg by mouth 2 (two) times daily. (0700 &1900)    LO LOESTRIN FE 1 MG-10 MCG / 10 MCG tablet Generic drug:  Norethindrone-Ethinyl Estradiol-Fe Biphas TAKE ONE TABLET BY MOUTH EVERY DAY What changed:    how much to take  how to take this  when to take this    Magnesium Oxide 200 MG Tabs Take 1 tablet (200 mg total) by mouth daily at 6 (six) AM.    methocarbamol 500 MG tablet Commonly known as:  ROBAXIN Take 1 tablet (500 mg total) by mouth every 6 (six) hours as needed for muscle spasms.    midazolam 5 MG/ML injection Commonly known as:  VERSED Place 5 mg into the nose See admin instructions. Place 1 ml (5 mg) in each nostril or on each side mouth between cheek and gum for total of 10 mg for seizure > 5 minutes  omega-3 acid ethyl esters 1 g capsule Commonly known as:  LOVAZA Take 1 g by mouth daily. (0700)    risperiDONE 1 MG tablet Commonly known as:  RISPERDAL Take 1 mg by mouth 2 (two) times daily. (0700 &1900)    STOOL SOFTENER 100 MG capsule Generic drug:  docusate sodium TAKE ONE CAPSULE BY MOUTH EVERY MORNING What changed:    how much to take  how to take this  when to take this    THEREMS Tabs TAKE ONE TABLET BY MOUTH EVERY EVENING What changed:    how much to take  how to take this  when to take this       Relevant Imaging Results:  Relevant Lab Results:   Additional Information (SSN: [id number])  Judi Cong, LCSW

## 2017-10-20 NOTE — Evaluation (Signed)
Physical Therapy Evaluation Patient Details Name: Angelica Stevenson MRN: [id number] DOB: [birth year] Today's Date: 10/20/2017   History of Present Illness  Pt admitted s/p L trimalleolar ankle fracture.  PMH includes ADD, constipation, partial epilepsy, speech abnormalities, static encephalopathy, encopresis, intermittent explosive disorder and closed fracture of the R foot..  Clinical Impression  Pt is a 22 year old female who lives in a group home and has a dx of MR.  Pt is able to perform bed mobility with min assist and requires max assist for transfers.  Per pt's parents, she is unable to follow directions for use of an AD.  Pt reported mild pain in L ankle following pivot transfer from bed to chair but tolerated treatment well.  Pt presented with a soaked sock on her R foot which appeared to be caused by drainage.  PT removed the sock and notified nurse.  This should be monitored.  Pt will continue to benefit from skilled PT to improve strength, ROM, pain management and tolerance to activity.    Follow Up Recommendations Home health PT(Per pt's parents, pt is already participating in PT in her group home.)    Equipment Recommendations       Recommendations for Other Services       Precautions / Restrictions Precautions Precautions: Fall Restrictions Weight Bearing Restrictions: Yes LLE Weight Bearing: Non weight bearing      Mobility  Bed Mobility Overal bed mobility: Modified Independent             General bed mobility comments: Pt is able to perform supine to sit and sit to supine with use of bedrail.  Family assisted pt on one occasion but pt demonstrated ability to perform on her own.  Transfers Overall transfer level: Needs assistance   Transfers: Stand Pivot Transfers Sit to Stand: Max assist Stand pivot transfers: Max assist       General transfer comment: Pt was able to WB through R LE and to assist in pivot transfer from bed to chair.  PT placed foot  beneath pt's L foot to ensure compliance with NWB status.  PT provided VC's for placement of UE and LE.  Ambulation/Gait Ambulation/Gait assistance: (Unable to perform.)              Stairs            Wheelchair Mobility    Modified Rankin (Stroke Patients Only)       Balance Overall balance assessment: Needs assistance Sitting-balance support: Feet supported;Bilateral upper extremity supported           Standing balance comment: PT provided max assist for standing balance.  Pt's parents state that pt does not have ability to follow directions regarding use of an AD.                             Pertinent Vitals/Pain Pain Assessment: Faces Faces Pain Scale: Hurts a little bit Pain Location: L ankle following transfer Pain Intervention(s): Limited activity within patient's tolerance    Home Living Family/patient expects to be discharged to:: Group home Living Arrangements: Group Home                    Prior Function Level of Independence: Needs assistance   Gait / Transfers Assistance Needed: WC dependent  ADL's / Homemaking Assistance Needed: Receives assistance with bathing and dressing        Hand Dominance  Extremity/Trunk Assessment   Upper Extremity Assessment Upper Extremity Assessment: Generalized weakness    Lower Extremity Assessment Lower Extremity Assessment: Generalized weakness    Cervical / Trunk Assessment Cervical / Trunk Assessment: Normal  Communication   Communication: Receptive difficulties;Expressive difficulties  Cognition Arousal/Alertness: Awake/alert Behavior During Therapy: WFL for tasks assessed/performed Overall Cognitive Status: History of cognitive impairments - at baseline                                        General Comments      Exercises     Assessment/Plan    PT Assessment Patient needs continued PT services  PT Problem List Decreased  strength;Decreased range of motion;Decreased activity tolerance;Decreased balance;Decreased mobility;Pain       PT Treatment Interventions Gait training;Stair training;Functional mobility training;Therapeutic activities;Therapeutic exercise    PT Goals (Current goals can be found in the Care Plan section)  Acute Rehab PT Goals Patient Stated Goal: To return to group home. PT Goal Formulation: With patient/family Time For Goal Achievement: 11/03/17 Potential to Achieve Goals: Good    Frequency BID   Barriers to discharge        Co-evaluation               AM-PAC PT "6 Clicks" Daily Activity  Outcome Measure Difficulty turning over in bed (including adjusting bedclothes, sheets and blankets)?: A Little Difficulty moving from lying on back to sitting on the side of the bed? : A Little Difficulty sitting down on and standing up from a chair with arms (e.g., wheelchair, bedside commode, etc,.)?: A Lot Help needed moving to and from a bed to chair (including a wheelchair)?: A Lot Help needed walking in hospital room?: Total Help needed climbing 3-5 steps with a railing? : Total 6 Click Score: 12    End of Session Equipment Utilized During Treatment: Gait belt Activity Tolerance: Patient tolerated treatment well Patient left: in chair;with call bell/phone within reach;with chair alarm set Nurse Communication: Mobility status;Other (comment)(PT noted that sock on Pt's R foot was saturated with fluid, removed sock and notified NA.  RN was unavailable at this time.  ) PT Visit Diagnosis: Unsteadiness on feet (R26.81);Muscle weakness (generalized) (M62.81);Pain Pain - Right/Left: Left Pain - part of body: Ankle and joints of foot    Time: 1000-1030 PT Time Calculation (min) (ACUTE ONLY): 30 min   Charges:   PT Evaluation $PT Eval Moderate Complexity: 1 Mod PT Treatments $Therapeutic Activity: 8-22 mins   PT G Codes:   PT G-Codes **NOT FOR INPATIENT CLASS** Functional  Assessment Tool Used: AM-PAC 6 Clicks Basic Mobility Functional Limitation: Changing and maintaining body position Changing and Maintaining Body Position Current Status (N8295(G8981): At least 60 percent but less than 80 percent impaired, limited or restricted Changing and Maintaining Body Position Goal Status (A2130(G8982): At least 20 percent but less than 40 percent impaired, limited or restricted    Glenetta HewSarah Frenchie Dangerfield, PT, DPT   Glenetta HewSarah Darey Hershberger 10/20/2017, 11:38 AM

## 2017-10-20 NOTE — Discharge Summary (Addendum)
Physician Discharge Summary  Patient ID: Angelica Stevenson MRN: [id number] DOB/AGE: 12-20-1995 22 y.o.  Admit date: 10/18/2017 Discharge date: 10/20/2017  Admission Diagnoses:  Displaced bimalleolar fracture-left ankle <principal problem not specified>  Discharge Diagnoses:  Displaced bimalleolar fracture-left ankle Active Problems:   Bimalleolar ankle fracture, left, closed, initial encounter   Past Medical History:  Diagnosis Date  . Attention deficit disorder (ADD) without hyperactivity   . Constipation   . Partial epilepsy with impairment of consciousness, intractable (HCC)   . Speech abnormality   . Static encephalopathy     Surgeries: Procedure(s): OPEN REDUCTION INTERNAL FIXATION (ORIF) ANKLE FRACTURE on 10/18/2017   Consultants (if any): Treatment Team:  Katha Hamming, MD  Discharged Condition: Improved  Hospital Course: Angelica Stevenson is an 22 y.o. female who was admitted 10/18/2017 with a diagnosis of  Displaced bimalleolar fracture-left ankle <principal problem not specified> and went to the operating room on 10/18/2017 and underwent the above named procedures.  She did have an elevated heart rate and the hospitalist service was consulted and provided treatment.  She was given perioperative antibiotics:  Anti-infectives (From admission, onward)   Start     Dose/Rate Route Frequency Ordered Stop   10/18/17 1600  ceFAZolin (ANCEF) IVPB 2g/100 mL premix     2 g 200 mL/hr over 30 Minutes Intravenous Every 6 hours 10/18/17 1413 10/18/17 2344   10/18/17 0859  ceFAZolin (ANCEF) 2-4 GM/100ML-% IVPB    Comments:  Slemenda, Debra   : cabinet override      10/18/17 0859 10/18/17 1030   10/18/17 0600  ceFAZolin (ANCEF) IVPB 2g/100 mL premix     2 g 200 mL/hr over 30 Minutes Intravenous On call to O.R. 10/17/17 2226 10/18/17 1026    .  She was given sequential compression devices, early ambulation, and ECASA for DVT prophylaxis.  She benefited maximally from the  hospital stay and there were no complications.    Recent vital signs:  Vitals:   10/20/17 0946 10/20/17 1121  BP: (!) 99/52   Pulse: (!) 111 (!) 111  Resp: 19   Temp: 98.7 F (37.1 C)   SpO2: 97%     Recent laboratory studies:  Lab Results  Component Value Date   HGB 11.8 (L) 10/20/2017   HGB 12.1 10/19/2017   HGB 13.3 04/13/2017   Lab Results  Component Value Date   WBC 11.8 (H) 10/20/2017   PLT 290 10/20/2017   No results found for: INR Lab Results  Component Value Date   NA 139 10/20/2017   K 3.6 10/20/2017   CL 107 10/20/2017   CO2 24 10/20/2017   BUN 7 10/20/2017   CREATININE 0.65 10/20/2017   GLUCOSE 106 (H) 10/20/2017    Discharge Medications:   Allergies as of 10/20/2017      Reactions   Influenza Vaccines Other (See Comments)   seizures      Medication List    STOP taking these medications   calcium-vitamin D 500-200 MG-UNIT tablet Replaced by:  Calcium-Vitamin D 500-400 MG-UNIT Tabs   Magnesium Oxide 250 MG Tabs     TAKE these medications   acetaminophen 160 MG/5ML liquid Commonly known as:  TYLENOL Take 20 mLs (640 mg total) by mouth every 6 (six) hours as needed for fever or pain.   aspirin 325 MG EC tablet Take 1 tablet (325 mg total) by mouth 2 (two) times daily.   ATIVAN 1 MG tablet Generic drug:  LORazepam Take 1.5 mg by mouth every 8 (  eight) hours as needed for seizure (after any cluster of seizures, or prolonged seizure,).   bisacodyl 10 MG suppository Commonly known as:  DULCOLAX Place 1 suppository (10 mg total) rectally daily as needed for moderate constipation.   Calcium-Vitamin D 500-400 MG-UNIT Tabs Take 1 tablet by mouth daily. Replaces:  calcium-vitamin D 500-200 MG-UNIT tablet   carbamazepine 300 MG 12 hr capsule Commonly known as:  CARBATROL Take 300 mg by mouth 2 (two) times daily. (0700 &1900)   carbamazepine 200 MG 12 hr capsule Commonly known as:  CARBATROL Take 200 mg by mouth 2 (two) times daily. (0700 &  1900)   diltiazem 30 MG tablet Commonly known as:  CARDIZEM Take 1 tablet (30 mg total) by mouth every 6 (six) hours. Hold medication for systolic pressure less than 90 and/or diastolic pressure less than 60 Use medication if heart rate is greater than 110   guaifenesin 100 MG/5ML syrup Commonly known as:  ROBITUSSIN Take 100 mg by mouth every 4 (four) hours as needed for cough.   HYDROcodone-acetaminophen 5-325 MG tablet Commonly known as:  NORCO/VICODIN Take 1 tablet by mouth every 4 (four) hours as needed for moderate pain or severe pain.   lactulose 10 GM/15ML solution Commonly known as:  CHRONULAC Take 13.3333 g by mouth every evening. (1900)Take 20 mls by mouth every evening   lamoTRIgine 150 MG tablet Commonly known as:  LAMICTAL Take 300 mg by mouth 2 (two) times daily. (0700 &1900)   LO LOESTRIN FE 1 MG-10 MCG / 10 MCG tablet Generic drug:  Norethindrone-Ethinyl Estradiol-Fe Biphas TAKE ONE TABLET BY MOUTH EVERY DAY What changed:    how much to take  how to take this  when to take this   Magnesium Oxide 200 MG Tabs Take 1 tablet (200 mg total) by mouth daily at 6 (six) AM.   methocarbamol 500 MG tablet Commonly known as:  ROBAXIN Take 1 tablet (500 mg total) by mouth every 6 (six) hours as needed for muscle spasms.   midazolam 5 MG/ML injection Commonly known as:  VERSED Place 5 mg into the nose See admin instructions. Place 1 ml (5 mg) in each nostril or on each side mouth between cheek and gum for total of 10 mg for seizure > 5 minutes   omega-3 acid ethyl esters 1 g capsule Commonly known as:  LOVAZA Take 1 g by mouth daily. (0700)   risperiDONE 1 MG tablet Commonly known as:  RISPERDAL Take 1 mg by mouth 2 (two) times daily. (0700 &1900)   STOOL SOFTENER 100 MG capsule Generic drug:  docusate sodium TAKE ONE CAPSULE BY MOUTH EVERY MORNING What changed:    how much to take  how to take this  when to take this   THEREMS Tabs TAKE ONE TABLET  BY MOUTH EVERY EVENING What changed:    how much to take  how to take this  when to take this            Durable Medical Equipment  (From admission, onward)        Start     Ordered   10/20/17 1015  For home use only DME Walker rolling  Once    Question:  Patient needs a walker to treat with the following condition  Answer:  Ankle fracture, bimalleolar, closed   10/20/17 1015      Diagnostic Studies: Dg Ankle Left Port  Result Date: 10/18/2017 CLINICAL DATA:  Status post ORIF for bimalleolar left ankle  fracture. EXAM: PORTABLE LEFT ANKLE - 2 VIEW COMPARISON:  Preoperative study of June 12, 2017 FINDINGS: The patient has undergone plate and screw fixation of the mildly displaced distal fibular fracture. Alignment is near anatomic. Two screws been placed through the medial malleolar fracture with reduction of same. IMPRESSION: No immediate postprocedure complication following plate and screw fixation of a bimalleolar fracture. Electronically Signed   By: David  [place] M.D.   On: 10/18/2017 13:36    Disposition: group home    Contact information for after-discharge care    Destination    [name]   Service:  Group Home Contact information: [address] [place] [address] [phone]               Signed: Lyndle Herrlich ,MD 10/20/2017, 1:18 PM

## 2017-10-20 NOTE — Progress Notes (Signed)
Pt alert this shift. Iv fluids infusing without difficulty. Still remaining Tachycardic with heart rate in the lower 100's to 120's. Pt able to sleep in between care. Pain medication given during the night. Using external catheter to void.

## 2017-10-20 NOTE — Discharge Instructions (Signed)
Ankle Fracture A fracture is a break in a bone. A cast or splint may be used to protect the ankle and heal the break. Sometimes, surgery is needed. Follow these instructions at home:  Use crutches as told by your doctor. It is very important that you use your crutches correctly.  Do not put weight or pressure on the injured ankle until told by your doctor.  Keep your ankle raised (elevated) when sitting or lying down.  Apply ice to the ankle: ? Put ice in a plastic bag. ? Place a towel between your cast and the bag. ? Leave the ice on for 20 minutes, 2-3 times a day.  If you have a plaster or fiberglass cast: ? Do not try to scratch under the cast with any objects. ? Check the skin around the cast every day. You may put lotion on red or sore areas. ? Keep your cast dry and clean.  If you have a plaster splint: ? Wear the splint as told by your doctor. ? You can loosen the elastic around the splint if your toes get numb, tingle, or turn cold or blue.  Do not put pressure on any part of your cast or splint. It may break. Rest your plaster splint or cast only on a pillow the first 24 hours until it is fully hardened.  Cover your cast or splint with a plastic bag during showers.  Do not lower your cast or splint into water.  Take medicine as told by your doctor.  Do not drive until your doctor says it is safe.  Follow-up with your doctor as told. It is very important that you go to your follow-up visits. Contact a doctor if: The swelling and discomfort gets worse. Get help right away if:  Your splint or cast breaks.  You continue to have very bad pain.  You have new pain or swelling after your splint or cast was put on.  Your skin or toes below the injured ankle: ? Turn blue or gray. ? Feel cold, numb, or you cannot feel them.  There is a bad smell or yellowish white fluid (pus) coming from under the splint or cast. This information is not intended to replace advice  given to you by your health care provider. Make sure you discuss any questions you have with your health care provider. Document Released: 07/23/2009 Document Revised: 03/02/2016 Document Reviewed: 04/24/2013 Elsevier Interactive Patient Education  2017 Elsevier Inc.  

## 2017-10-20 NOTE — Clinical Social Work Note (Addendum)
CSW received a call from Ulyess Blossomonna Steele, the patient's RN from her facility to update that medications that are new to the patient will need hard scripts as well as the patient's pain medication. The RN also updated that her home medication for magnesium oxide should be 200 mg rather than 250 mg and the calcium should be 500-400 instead of 500-200. CSW has updated the attending MD. CSW will contact Ulyess BlossomDonna Steele once the updates are made to the discharge summary and FL 2.  CSW has sent updated documentation to [facility]ash[facility] and has left a VM alerting to such. CSW will deliver the discharge packet once Lup[facility] LeashDonna confirms acceptance.   Argentina PonderKaren Martha Doriann Zuch, MSW, Theresia MajorsLCSWA [phone]

## 2017-10-20 NOTE — Progress Notes (Addendum)
Heart rate better today than yesterday, please waiting to see how she does with physical therapy.  Slightly hypotensive but asymptomatic.  Still has a lot of ankle pain. 1.  Sinus tachycardia likely due to dehydration, related to left ankle pain.  Continue Cardizem 30 mg every 6 hours for another 2-3 days and see how she does.  At this time I do not think she needs any cardiac workup.  She did receive aggressive hydration.  use Cardizem if the heart rate is more than 110, hold if SBP less than 90/60. 2.  Left ankle fracture status post repair.  Family requested pain medicine prescription so that they can take to the pharmacy before they close at 2 PM today. Time spent 25 minutes.

## 2017-10-20 NOTE — Progress Notes (Signed)
Marcia BrashLauren Browning to be D/C'd group home per MD order.  Discussed prescriptions and follow up appointments with patients dad Prescriptions placed in packet, medication list explained in detail with parents, verbalized understanding.  Allergies as of 10/20/2017      Reactions   Influenza Vaccines Other (See Comments)   seizures      Medication List    STOP taking these medications   calcium-vitamin D 500-200 MG-UNIT tablet Replaced by:  Calcium-Vitamin D 500-400 MG-UNIT Tabs   Magnesium Oxide 250 MG Tabs     TAKE these medications   acetaminophen 160 MG/5ML liquid Commonly known as:  TYLENOL Take 20 mLs (640 mg total) by mouth every 6 (six) hours as needed for fever or pain.   aspirin 325 MG EC tablet Take 1 tablet (325 mg total) by mouth 2 (two) times daily.   ATIVAN 1 MG tablet Generic drug:  LORazepam Take 1.5 mg by mouth every 8 (eight) hours as needed for seizure (after any cluster of seizures, or prolonged seizure,).   bisacodyl 10 MG suppository Commonly known as:  DULCOLAX Place 1 suppository (10 mg total) rectally daily as needed for moderate constipation.   Calcium-Vitamin D 500-400 MG-UNIT Tabs Take 1 tablet by mouth daily. Replaces:  calcium-vitamin D 500-200 MG-UNIT tablet   carbamazepine 300 MG 12 hr capsule Commonly known as:  CARBATROL Take 300 mg by mouth 2 (two) times daily. (0700 &1900)   carbamazepine 200 MG 12 hr capsule Commonly known as:  CARBATROL Take 200 mg by mouth 2 (two) times daily. (0700 & 1900)   diltiazem 30 MG tablet Commonly known as:  CARDIZEM Take 1 tablet (30 mg total) by mouth every 6 (six) hours. Hold medication for systolic pressure less than 90 and/or diastolic pressure less than 60 Use medication if heart rate is greater than 110   guaifenesin 100 MG/5ML syrup Commonly known as:  ROBITUSSIN Take 100 mg by mouth every 4 (four) hours as needed for cough.   HYDROcodone-acetaminophen 5-325 MG tablet Commonly known as:   NORCO/VICODIN Take 1 tablet by mouth every 4 (four) hours as needed for moderate pain or severe pain.   lactulose 10 GM/15ML solution Commonly known as:  CHRONULAC Take 13.3333 g by mouth every evening. (1900)Take 20 mls by mouth every evening   lamoTRIgine 150 MG tablet Commonly known as:  LAMICTAL Take 300 mg by mouth 2 (two) times daily. (0700 &1900)   LO LOESTRIN FE 1 MG-10 MCG / 10 MCG tablet Generic drug:  Norethindrone-Ethinyl Estradiol-Fe Biphas TAKE ONE TABLET BY MOUTH EVERY DAY What changed:    how much to take  how to take this  when to take this   Magnesium Oxide 200 MG Tabs Take 1 tablet (200 mg total) by mouth daily at 6 (six) AM.   methocarbamol 500 MG tablet Commonly known as:  ROBAXIN Take 1 tablet (500 mg total) by mouth every 6 (six) hours as needed for muscle spasms.   midazolam 5 MG/ML injection Commonly known as:  VERSED Place 5 mg into the nose See admin instructions. Place 1 ml (5 mg) in each nostril or on each side mouth between cheek and gum for total of 10 mg for seizure > 5 minutes   omega-3 acid ethyl esters 1 g capsule Commonly known as:  LOVAZA Take 1 g by mouth daily. (0700)   risperiDONE 1 MG tablet Commonly known as:  RISPERDAL Take 1 mg by mouth 2 (two) times daily. (0700 &1900)   STOOL  SOFTENER 100 MG capsule Generic drug:  docusate sodium TAKE ONE CAPSULE BY MOUTH EVERY MORNING What changed:    how much to take  how to take this  when to take this   THEREMS Tabs TAKE ONE TABLET BY MOUTH EVERY EVENING What changed:    how much to take  how to take this  when to take this            Durable Medical Equipment  (From admission, onward)        Start     Ordered   10/20/17 1015  For home use only DME Walker rolling  Once    Question:  Patient needs a walker to treat with the following condition  Answer:  Ankle fracture, bimalleolar, closed   10/20/17 1015      Vitals:   10/20/17 0946 10/20/17 1121  BP: (!)  99/52   Pulse: (!) 111 (!) 111  Resp: 19   Temp: 98.7 F (37.1 C)   SpO2: 97%     Skin clean, dry and intact without evidence of skin break down, no evidence of skin tears noted. IV catheter discontinued intact. Site without signs and symptoms of complications. Dressing and pressure applied. Pt denies pain at this time. No complaints noted.  An After Visit Summary was printed and placed in packet Patient escorted via [facility], and D/C to group home by parents via private auto.. Packet given to parents and instructed to give to staff at group home  Angelica Stevenson Angelica Stevenson

## 2017-10-24 DIAGNOSIS — S82842A Displaced bimalleolar fracture of left lower leg, initial encounter for closed fracture: Secondary | ICD-10-CM | POA: Diagnosis not present

## 2017-10-30 DIAGNOSIS — G40219 Localization-related (focal) (partial) symptomatic epilepsy and epileptic syndromes with complex partial seizures, intractable, without status epilepticus: Secondary | ICD-10-CM | POA: Diagnosis not present

## 2017-10-30 DIAGNOSIS — G40409 Other generalized epilepsy and epileptic syndromes, not intractable, without status epilepticus: Secondary | ICD-10-CM | POA: Diagnosis not present

## 2017-11-12 DIAGNOSIS — N3946 Mixed incontinence: Secondary | ICD-10-CM | POA: Diagnosis not present

## 2017-11-12 DIAGNOSIS — F79 Unspecified intellectual disabilities: Secondary | ICD-10-CM | POA: Diagnosis not present

## 2017-11-21 DIAGNOSIS — S82842D Displaced bimalleolar fracture of left lower leg, subsequent encounter for closed fracture with routine healing: Secondary | ICD-10-CM | POA: Diagnosis not present

## 2017-11-30 IMAGING — DX DG KNEE COMPLETE 4+V*L*
4 series · 4 of 4 positions shown · non-contrast
Comparison: None.

CLINICAL DATA: Fall.  Knee pain

EXAM:
LEFT KNEE - COMPLETE 4+ VIEW

[knee ap]
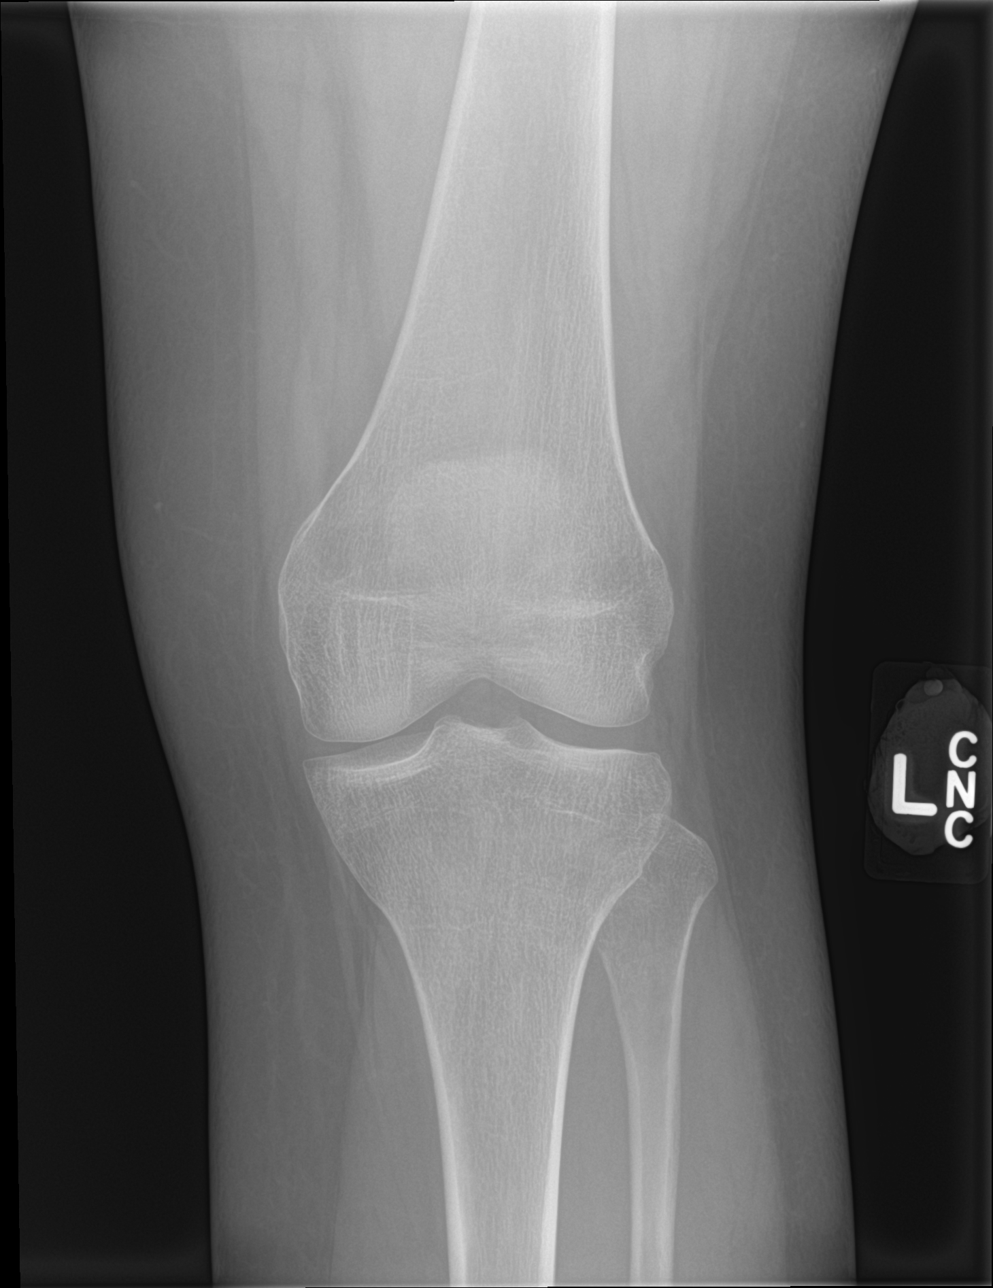

[knee lat]
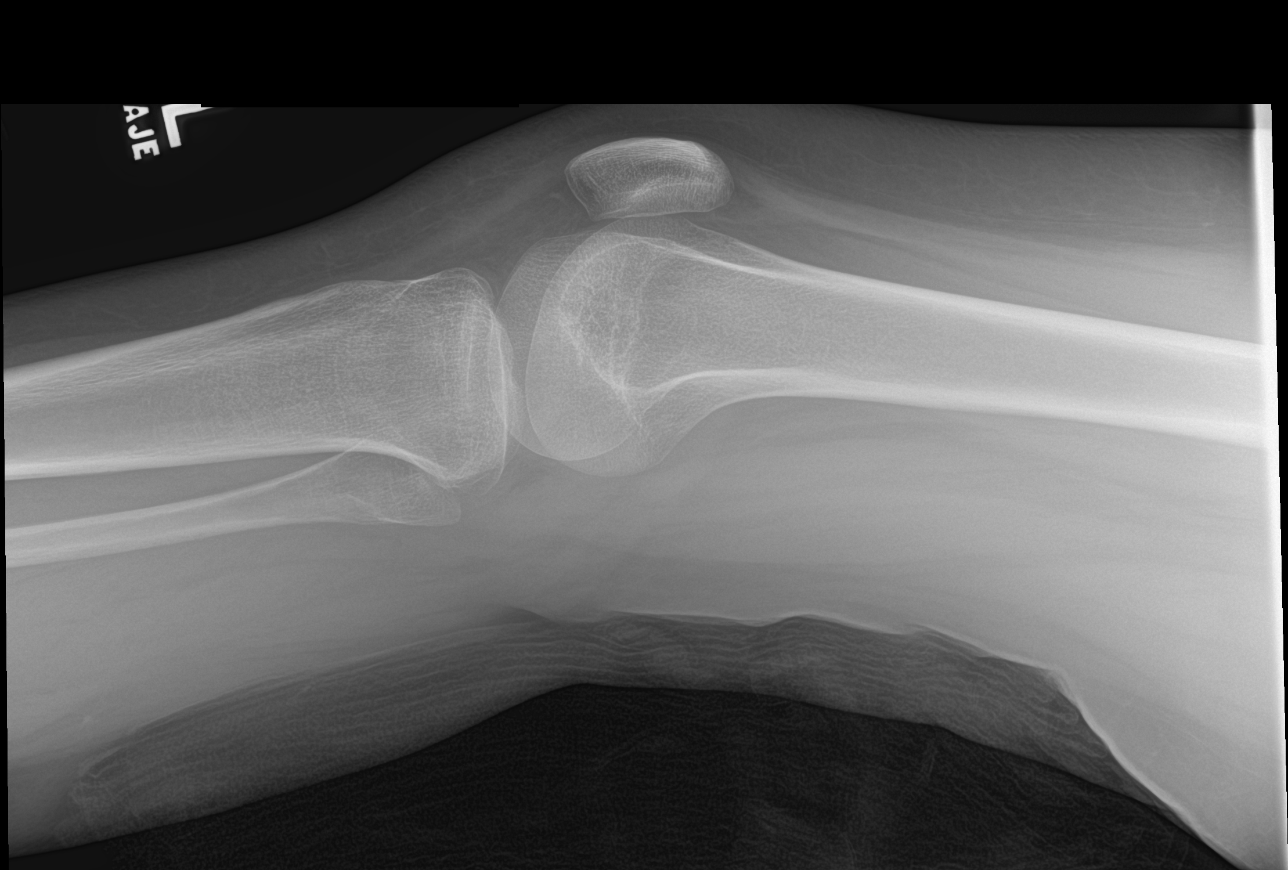

[knee obl (1 of 2)]
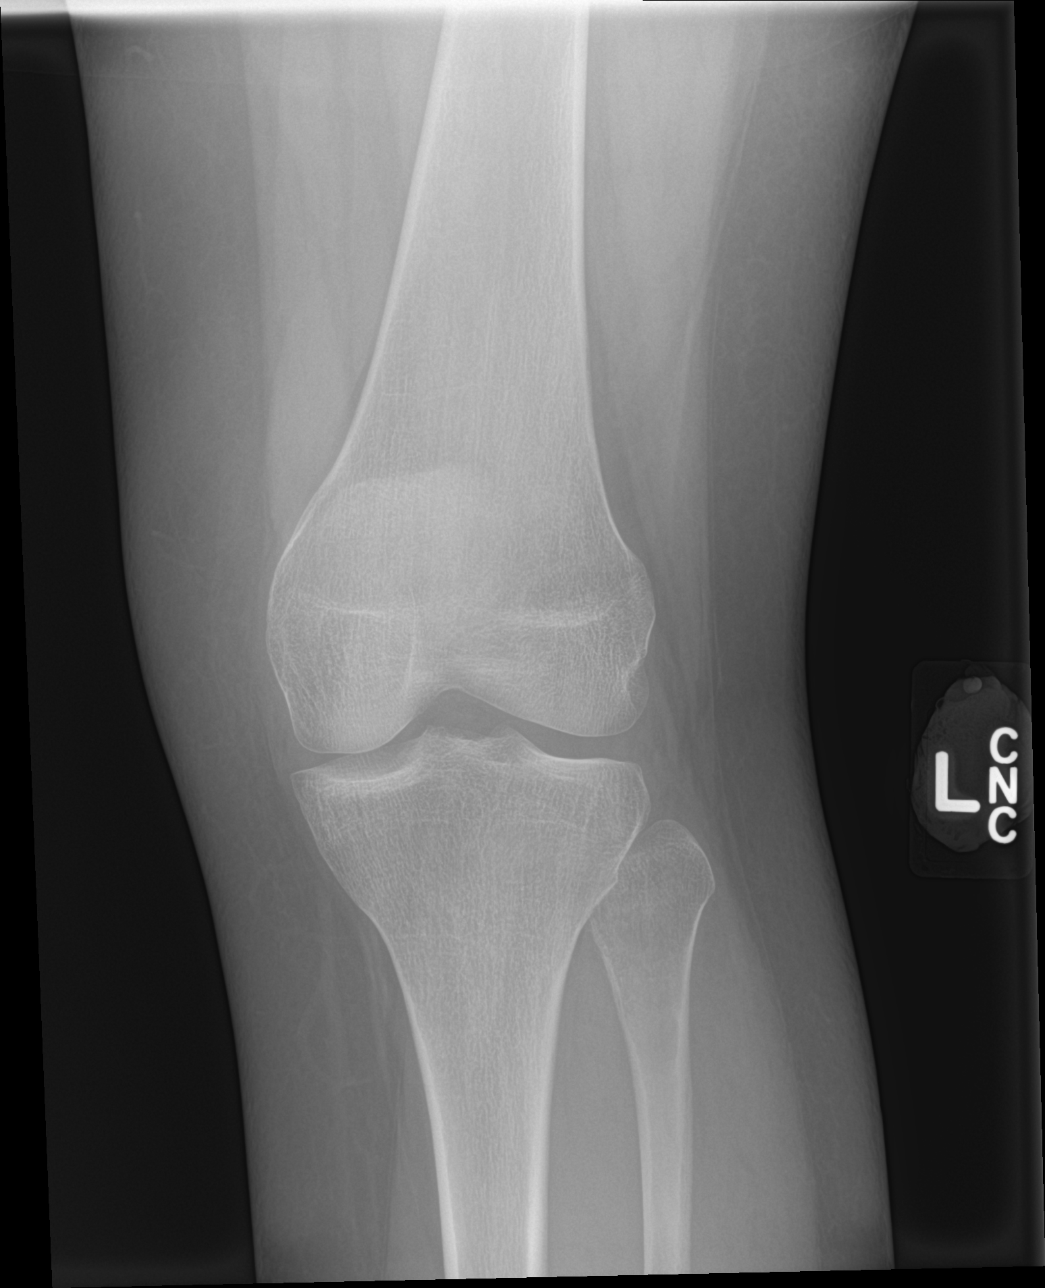

[knee obl (2 of 2)]
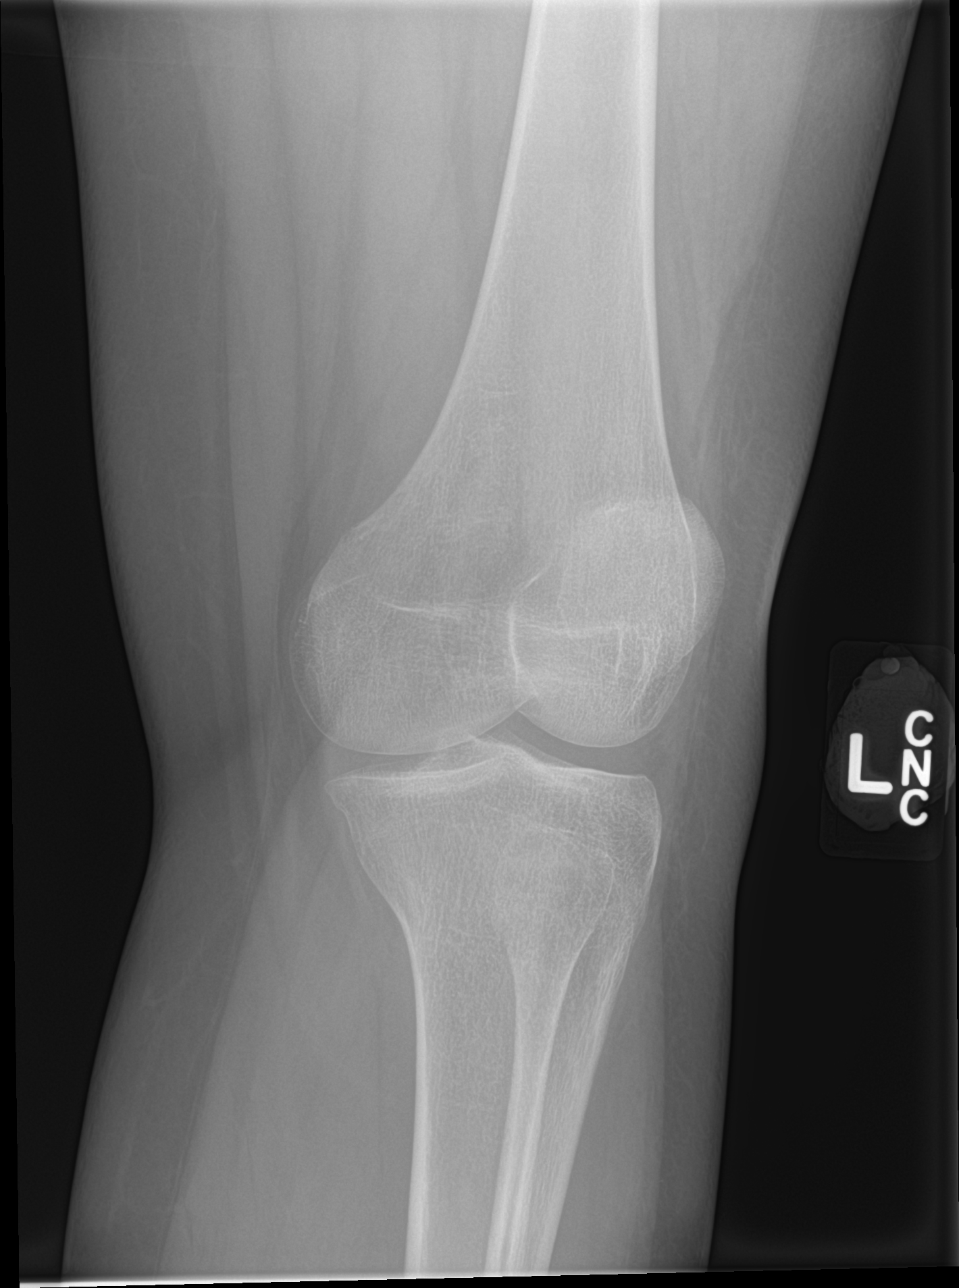

[4 of 4 positions shown; findings below may reference images not displayed]

FINDINGS: No evidence of fracture, dislocation, or joint effusion. No evidence
of arthropathy or other focal bone abnormality. Soft tissues are
unremarkable.
IMPRESSION: Negative.

## 2017-12-01 IMAGING — CR DG PELVIS 1-2V
1 series · 1 of 1 positions shown · non-contrast
Comparison: None.

CLINICAL DATA: Tripped over a ride can fell at school yesterday.
Abnormal gait. Initial encounter.

EXAM:
PELVIS - 1-2 VIEW

[pelvis ap]
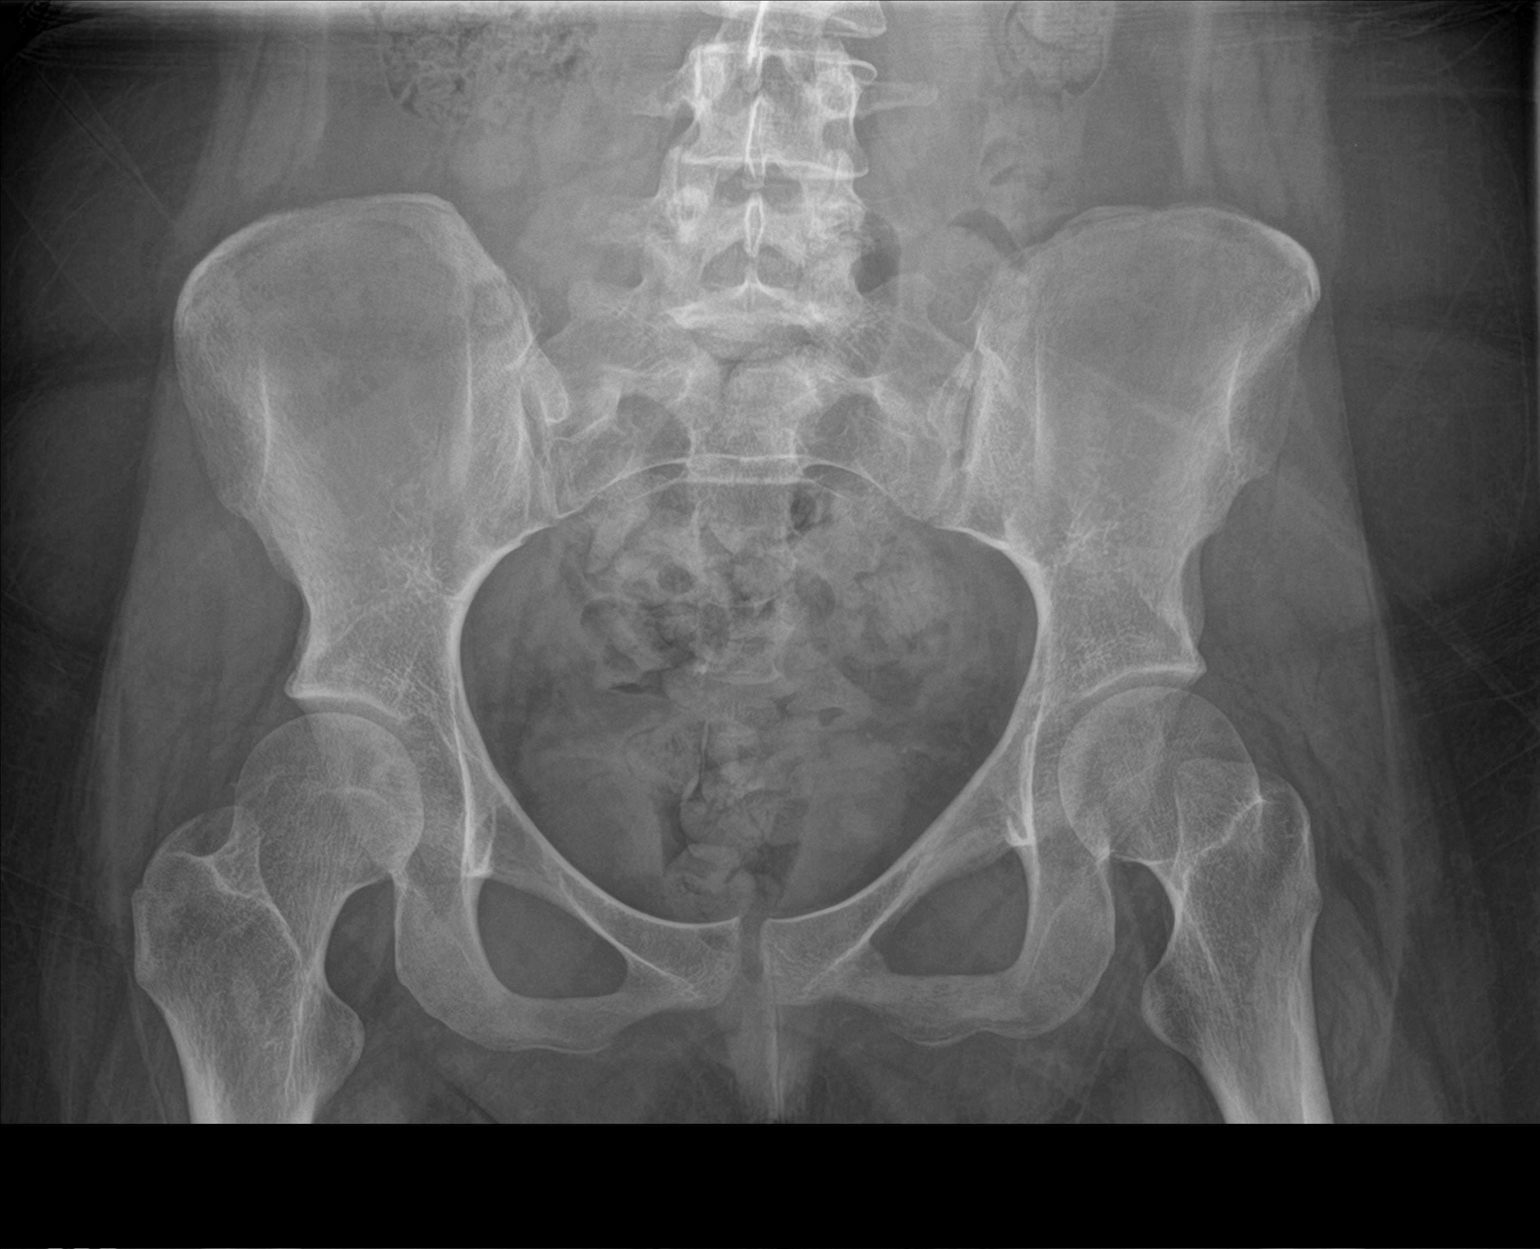

[1 of 1 positions shown; findings below may reference images not displayed]

FINDINGS: There is no evidence of pelvic fracture or diastasis. No pelvic bone
lesions are seen.
IMPRESSION: Negative.

## 2017-12-03 ENCOUNTER — Telehealth: Payer: Self-pay | Admitting: Family Medicine

## 2017-12-03 NOTE — Telephone Encounter (Signed)
Copied from CRM [id number]. Topic: Quick Communication - See Telephone Encounter >> Dec 03, 2017  9:01 AM Landry MellowFoltz, Melissa J wrote: CRM for notification. See Telephone encounter for:   12/03/17. [facility] - she needs an order to d/c pt's magnesium 250 mg and to start magnesium centrate 250mg .  Gwen says that [facility] will not fill without an order.   Please call gwen if any questions at [phone]

## 2017-12-03 NOTE — Telephone Encounter (Signed)
New order written.

## 2017-12-04 NOTE — Telephone Encounter (Signed)
kadima with [facility] called - magnesium citrate comes in 200 mg, not 250.  They also need quantity and dose instructions.  Please call  [phone] Fax [phone]

## 2017-12-04 NOTE — Telephone Encounter (Signed)
Called and left a message for Angelica Stevenson to see if the 200mg  is ok along with the dose that she currently takes.

## 2017-12-05 ENCOUNTER — Telehealth: Payer: Self-pay

## 2017-12-05 NOTE — Telephone Encounter (Signed)
Received fax stating:   "Hello,  I am witting to get an order for TUCKS wipes and an Anusol or similar suppository. Nancylee has large BM's and often we see some bright red blood w/ BMs. When I checked her she told me her bottom 'itched'. I have asked staff to give her prune juice/prunes. With her leg casted and not bearing weight she is not as active.   Meds:  Colace 100 mg prn q am  Magnesium citrate 200 mg  Calcium w/ Vitamin D  BC - lo loestrin daily  Omega III fist iol  Lamictal 150 mg PO BID  Resperdal  Lactulose 10 gm/15 mL -20 mL po in evening   I have instructed staff to use Biscolax suppository for no BM > 2 days.   Please fax order to [phone] or [facility].   Thanks,   Shanon Payoronna Stack, RN"   Original letter placed in scan box. Please advise

## 2017-12-06 NOTE — Telephone Encounter (Signed)
Order written up

## 2017-12-06 NOTE — Telephone Encounter (Signed)
Can we get this on a form and I'll sign it? Thanks!

## 2017-12-10 DIAGNOSIS — N3946 Mixed incontinence: Secondary | ICD-10-CM | POA: Diagnosis not present

## 2017-12-10 DIAGNOSIS — F79 Unspecified intellectual disabilities: Secondary | ICD-10-CM | POA: Diagnosis not present

## 2017-12-10 NOTE — Telephone Encounter (Signed)
Verbal given for Magnesium citrate 200 mg daily 30 with 11 refills

## 2017-12-10 NOTE — Telephone Encounter (Signed)
Magnesium citrate 200 mg once daily.

## 2017-12-17 ENCOUNTER — Telehealth: Payer: Self-pay | Admitting: Family Medicine

## 2017-12-17 NOTE — Telephone Encounter (Signed)
Copied from CRM [phone]. Topic: Quick Communication - See Telephone Encounter >> Dec 17, 2017  5:01 PM Landry MellowFoltz, Melissa J wrote: CRM for notification. See Telephone encounter for:   12/17/17. Sheniqua [facility] - calling to get a d/c order to discontinue her Asprin.  Please fax to [phone] Cb is [phone]

## 2017-12-18 NOTE — Telephone Encounter (Signed)
Routing to provider. OK to write order?

## 2017-12-18 NOTE — Telephone Encounter (Signed)
OK to write order 

## 2017-12-18 NOTE — Telephone Encounter (Signed)
Order signed by Dr. Laural BenesJohnson and faxed back to [facility].

## 2017-12-18 NOTE — Telephone Encounter (Signed)
Order written. Will get provider to sign and then fax to [facility].

## 2017-12-20 DIAGNOSIS — S82842A Displaced bimalleolar fracture of left lower leg, initial encounter for closed fracture: Secondary | ICD-10-CM | POA: Diagnosis not present

## 2017-12-24 ENCOUNTER — Other Ambulatory Visit: Payer: Self-pay | Admitting: Family Medicine

## 2018-01-08 DIAGNOSIS — F79 Unspecified intellectual disabilities: Secondary | ICD-10-CM | POA: Diagnosis not present

## 2018-01-08 DIAGNOSIS — N3946 Mixed incontinence: Secondary | ICD-10-CM | POA: Diagnosis not present

## 2018-01-17 DIAGNOSIS — S82842A Displaced bimalleolar fracture of left lower leg, initial encounter for closed fracture: Secondary | ICD-10-CM | POA: Diagnosis not present

## 2018-01-22 ENCOUNTER — Ambulatory Visit: Payer: Self-pay

## 2018-01-22 NOTE — Telephone Encounter (Addendum)
Caregiver, Gwen,called to report a rash to left arm, breast and thigh after using a new soap. Small areas about "the size of a pencil lead." No itching or pain noted. "Just a few spots." Instructed Gwen if rash worsens or pt. Develops new symptoms to go to ED. Reason for Disposition . Mild widespread rash  Answer Assessment - Initial Assessment Questions 1. APPEARANCE of RASH: "Describe the rash." (e.g., spots, blisters, raised areas, skin peeling, scaly)     Small areas left arm and breast; left thigh. About "the size of a pecil lead." 2. SIZE: "How big are the spots?" (e.g., tip of pen, eraser, coin; inches, centimeters)     Tip of pencil 3. LOCATION: "Where is the rash located?"     Left arm, left breast  4. COLOR: "What color is the rash?" (Note: It is difficult to assess rash color in people with darker-colored skin. When this situation occurs, simply ask the caller to describe what they see.)     Pink 5. ONSET: "When did the rash begin?"     Noticed this morning during her bath 6. FEVER: "Do you have a fever?" If so, ask: "What is your temperature, how was it measured, and when did it start?"     No 7. ITCHING: "Does the rash itch?" If so, ask: "How bad is the itch?" (Scale 1-10; or mild, moderate, severe)     No 8. CAUSE: "What do you think is causing the rash?"     New soap 9. MEDICATION FACTORS: "Have you started any new medications within the last 2 weeks?" (e.g., antibiotics)      No 10. OTHER SYMPTOMS: "Do you have any other symptoms?" (e.g., dizziness, headache, sore throat, joint pain)       No 11. PREGNANCY: "Is there any chance you are pregnant?" "When was your last menstrual period?"       No  Protocols used: RASH OR REDNESS - [facility]

## 2018-01-23 ENCOUNTER — Ambulatory Visit: Payer: Self-pay | Admitting: Unknown Physician Specialty

## 2018-01-23 ENCOUNTER — Ambulatory Visit: Payer: Self-pay | Admitting: Family Medicine

## 2018-01-25 ENCOUNTER — Ambulatory Visit (INDEPENDENT_AMBULATORY_CARE_PROVIDER_SITE_OTHER): Payer: BLUE CROSS/BLUE SHIELD | Admitting: Family Medicine

## 2018-01-25 ENCOUNTER — Encounter: Payer: Self-pay | Admitting: Family Medicine

## 2018-01-25 VITALS — BP 114/80 | HR 101 | Temp 98.0°F | Wt 170.0 lb

## 2018-01-25 DIAGNOSIS — L240 Irritant contact dermatitis due to detergents: Secondary | ICD-10-CM | POA: Diagnosis not present

## 2018-01-25 MED ORDER — TRIAMCINOLONE ACETONIDE 0.1 % EX CREA: 1.0000 "application " | TOPICAL_CREAM | Freq: Two times a day (BID) | CUTANEOUS | 3 refills | Status: AC

## 2018-01-25 NOTE — Progress Notes (Signed)
BP 114/80 (BP Location: Left Arm, Patient Position: Sitting, Cuff Size: Normal)   Pulse (!) 101   Temp 98 F (36.7 C)   Wt 170 lb (77.1 kg)   SpO2 99%   BMI 30.11 kg/m    Subjective:    Patient ID: Angelica BrashLauren Vanacker, female    DOB: 1996-09-10, 22 y.o.   MRN: [id number]  HPI: Angelica Stevenson is a 22 y.o. female  Chief Complaint  Patient presents with  . Rash   RASH Duration:  Earlier this week  Location: arms and thighs  Itching: yes Burning: yes Redness: yes Oozing: no Scaling: no Blisters: no Painful: no Fevers: no Change in detergents/soaps/personal care products: yes Recent illness: no Recent travel:no History of same: no Context: better Alleviating factors: nothing Treatments attempted:nothing Shortness of breath: no  Throat/tongue swelling: no Myalgias/arthralgias: no   Relevant past medical, surgical, family and social history reviewed and updated as indicated. Interim medical history since our last visit reviewed. Allergies and medications reviewed and updated.  Review of Systems  Constitutional: Negative.   Respiratory: Negative.   Cardiovascular: Negative.   Musculoskeletal: Negative.   Skin: Positive for rash. Negative for color change, pallor and wound.  Psychiatric/Behavioral: Negative.     Per HPI unless specifically indicated above     Objective:    BP 114/80 (BP Location: Left Arm, Patient Position: Sitting, Cuff Size: Normal)   Pulse (!) 101   Temp 98 F (36.7 C)   Wt 170 lb (77.1 kg)   SpO2 99%   BMI 30.11 kg/m   Wt Readings from Last 3 Encounters:  01/25/18 170 lb (77.1 kg)  10/18/17 172 lb (78 kg) (92 %, Z= 1.42)*  10/16/17 170 lb (77.1 kg) (92 %, Z= 1.37)*   * Growth percentiles are based on CDC (Girls, 2-20 Years) data.    Physical Exam  Constitutional: She is oriented to person, place, and time. She appears well-developed and well-nourished. No distress.  HENT:  Head: Normocephalic and atraumatic.  Right Ear: Hearing  normal.  Left Ear: Hearing normal.  Nose: Nose normal.  Eyes: Conjunctivae and lids are normal. Right eye exhibits no discharge. Left eye exhibits no discharge. No scleral icterus.  Cardiovascular: Normal rate, regular rhythm, normal heart sounds and intact distal pulses. Exam reveals no gallop and no friction rub.  No murmur heard. Pulmonary/Chest: Effort normal and breath sounds normal. No stridor. No respiratory distress. She has no wheezes. She has no rales. She exhibits no tenderness.  Musculoskeletal: Normal range of motion.  Neurological: She is alert and oriented to person, place, and time.  Skin: Skin is warm, dry and intact. Capillary refill takes less than 2 seconds. Rash noted. There is erythema. No pallor.  Lacey, erythematous rash on arms bilaterally   Psychiatric: She has a normal mood and affect. Her speech is normal and behavior is normal. Judgment and thought content normal. Cognition and memory are normal.  Nursing note and vitals reviewed.   Results for orders placed or performed during the hospital encounter of 10/18/17  CBC  Result Value Ref Range   WBC 10.1 3.6 - 11.0 K/uL   RBC 3.93 3.80 - 5.20 MIL/uL   Hemoglobin 12.1 12.0 - 16.0 g/dL   HCT 01.035.2 27.235.0 - 53.647.0 %   MCV 89.7 80.0 - 100.0 fL   MCH 30.9 26.0 - 34.0 pg   MCHC 34.5 32.0 - 36.0 g/dL   RDW 64.412.1 03.411.5 - 74.214.5 %   Platelets 282 150 - 440  K/uL  Basic metabolic panel  Result Value Ref Range   Sodium 139 135 - 145 mmol/L   Potassium 3.6 3.5 - 5.1 mmol/L   Chloride 105 101 - 111 mmol/L   CO2 26 22 - 32 mmol/L   Glucose, Bld 124 (H) 65 - 99 mg/dL   BUN 6 6 - 20 mg/dL   Creatinine, Ser 1.91 0.44 - 1.00 mg/dL   Calcium 8.7 (L) 8.9 - 10.3 mg/dL   GFR calc non Af Amer >60 >60 mL/min   GFR calc Af Amer >60 >60 mL/min   Anion gap 8 5 - 15  CBC  Result Value Ref Range   WBC 11.8 (H) 3.6 - 11.0 K/uL   RBC 3.86 3.80 - 5.20 MIL/uL   Hemoglobin 11.8 (L) 12.0 - 16.0 g/dL   HCT 47.8 29.5 - 62.1 %   MCV 91.0  80.0 - 100.0 fL   MCH 30.6 26.0 - 34.0 pg   MCHC 33.6 32.0 - 36.0 g/dL   RDW 30.8 65.7 - 84.6 %   Platelets 290 150 - 440 K/uL  Basic metabolic panel  Result Value Ref Range   Sodium 139 135 - 145 mmol/L   Potassium 3.6 3.5 - 5.1 mmol/L   Chloride 107 101 - 111 mmol/L   CO2 24 22 - 32 mmol/L   Glucose, Bld 106 (H) 65 - 99 mg/dL   BUN 7 6 - 20 mg/dL   Creatinine, Ser 9.62 0.44 - 1.00 mg/dL   Calcium 8.6 (L) 8.9 - 10.3 mg/dL   GFR calc non Af Amer >60 >60 mL/min   GFR calc Af Amer >60 >60 mL/min   Anion gap 8 5 - 15  Pregnancy, urine POC  Result Value Ref Range   Preg Test, Ur NEGATIVE NEGATIVE      Assessment & Plan:   Problem List Items Addressed This Visit    None    Visit Diagnoses    Irritant contact dermatitis due to detergent    -  Primary   Will treat with trimacinalone. Call with any concerns. Use Dove soap instead of harsher one.        Follow up plan: Return if symptoms worsen or fail to improve.

## 2018-01-29 DIAGNOSIS — S82842A Displaced bimalleolar fracture of left lower leg, initial encounter for closed fracture: Secondary | ICD-10-CM | POA: Diagnosis not present

## 2018-02-07 DIAGNOSIS — F79 Unspecified intellectual disabilities: Secondary | ICD-10-CM | POA: Diagnosis not present

## 2018-02-07 DIAGNOSIS — N3946 Mixed incontinence: Secondary | ICD-10-CM | POA: Diagnosis not present

## 2018-02-07 DIAGNOSIS — S82842A Displaced bimalleolar fracture of left lower leg, initial encounter for closed fracture: Secondary | ICD-10-CM | POA: Diagnosis not present

## 2018-03-07 DIAGNOSIS — S82842A Displaced bimalleolar fracture of left lower leg, initial encounter for closed fracture: Secondary | ICD-10-CM | POA: Diagnosis not present

## 2018-03-11 DIAGNOSIS — F79 Unspecified intellectual disabilities: Secondary | ICD-10-CM | POA: Diagnosis not present

## 2018-03-11 DIAGNOSIS — N3946 Mixed incontinence: Secondary | ICD-10-CM | POA: Diagnosis not present

## 2018-04-02 ENCOUNTER — Other Ambulatory Visit: Payer: Self-pay | Admitting: Family Medicine

## 2018-04-04 NOTE — Telephone Encounter (Signed)
Therems refill req per pharm; last refill 03/22/17; # 30; RF x 11  Last Office Visit: 01/25/18 Acute                             10/16/17 Pre-op Physical  PCP: Dr. Laural BenesJohnson   Refilled Therems per protocol

## 2018-04-09 DIAGNOSIS — F79 Unspecified intellectual disabilities: Secondary | ICD-10-CM | POA: Diagnosis not present

## 2018-04-09 DIAGNOSIS — N3946 Mixed incontinence: Secondary | ICD-10-CM | POA: Diagnosis not present

## 2018-04-30 DIAGNOSIS — G40219 Localization-related (focal) (partial) symptomatic epilepsy and epileptic syndromes with complex partial seizures, intractable, without status epilepticus: Secondary | ICD-10-CM | POA: Diagnosis not present

## 2018-05-10 DIAGNOSIS — F79 Unspecified intellectual disabilities: Secondary | ICD-10-CM | POA: Diagnosis not present

## 2018-05-10 DIAGNOSIS — N3946 Mixed incontinence: Secondary | ICD-10-CM | POA: Diagnosis not present

## 2018-06-11 DIAGNOSIS — N3946 Mixed incontinence: Secondary | ICD-10-CM | POA: Diagnosis not present

## 2018-06-11 DIAGNOSIS — F79 Unspecified intellectual disabilities: Secondary | ICD-10-CM | POA: Diagnosis not present

## 2018-07-10 DIAGNOSIS — N3946 Mixed incontinence: Secondary | ICD-10-CM | POA: Diagnosis not present

## 2018-07-10 DIAGNOSIS — F79 Unspecified intellectual disabilities: Secondary | ICD-10-CM | POA: Diagnosis not present

## 2018-08-12 DIAGNOSIS — F79 Unspecified intellectual disabilities: Secondary | ICD-10-CM | POA: Diagnosis not present

## 2018-08-12 DIAGNOSIS — N3946 Mixed incontinence: Secondary | ICD-10-CM | POA: Diagnosis not present

## 2018-09-10 DIAGNOSIS — F79 Unspecified intellectual disabilities: Secondary | ICD-10-CM | POA: Diagnosis not present

## 2018-09-10 DIAGNOSIS — N3946 Mixed incontinence: Secondary | ICD-10-CM | POA: Diagnosis not present

## 2018-09-21 ENCOUNTER — Ambulatory Visit (INDEPENDENT_AMBULATORY_CARE_PROVIDER_SITE_OTHER): Payer: BLUE CROSS/BLUE SHIELD

## 2018-09-21 ENCOUNTER — Ambulatory Visit
Admission: EM | Admit: 2018-09-21 | Discharge: 2018-09-21 | Disposition: A | Discharge status: Home / Self Care | Payer: BLUE CROSS/BLUE SHIELD | Attending: Emergency Medicine | Admitting: Emergency Medicine

## 2018-09-21 DIAGNOSIS — M25461 Effusion, right knee: Secondary | ICD-10-CM | POA: Diagnosis not present

## 2018-09-21 DIAGNOSIS — W010XXA Fall on same level from slipping, tripping and stumbling without subsequent striking against object, initial encounter: Secondary | ICD-10-CM | POA: Diagnosis not present

## 2018-09-21 DIAGNOSIS — S8001XA Contusion of right knee, initial encounter: Secondary | ICD-10-CM | POA: Diagnosis not present

## 2018-09-21 DIAGNOSIS — Y92811 Bus as the place of occurrence of the external cause: Secondary | ICD-10-CM

## 2018-09-21 DIAGNOSIS — M25561 Pain in right knee: Secondary | ICD-10-CM | POA: Diagnosis not present

## 2018-09-21 DIAGNOSIS — S8991XA Unspecified injury of right lower leg, initial encounter: Secondary | ICD-10-CM | POA: Diagnosis not present

## 2018-09-21 DIAGNOSIS — M7989 Other specified soft tissue disorders: Secondary | ICD-10-CM | POA: Diagnosis not present

## 2018-09-21 NOTE — Discharge Instructions (Signed)
Refer to physician order sheet

## 2018-09-21 NOTE — ED Provider Notes (Addendum)
[facility]    CSN: [id number] Arrival date & time: 09/21/18  1101     History   Chief Complaint Chief Complaint  Patient presents with  . Knee Pain    HPI Angelica Stevenson is a 22 y.o. female.   HPI  -year-old female accompanied by her case worker.  The patient is a resident Of a group home at [facility].  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  She states that yesterday while on the school bus tripped over a chair and landed on her knees.  Since then has been complaining of anterior knee pain and difficulty with ambulation.        Past Medical History:  Diagnosis Date  . Attention deficit disorder (ADD) without hyperactivity   . Constipation   . Partial epilepsy with impairment of consciousness, intractable (HCC)   . Speech abnormality   . Static encephalopathy     Patient Active Problem List   Diagnosis Date Noted  . Bimalleolar ankle fracture, left, closed, initial encounter 10/18/2017  . Intermittent explosive disorder 10/13/2016  . Speech delay 10/13/2016  . Encopresis 10/13/2016  . Dysmenorrhea treated with oral contraceptive 10/13/2016  . Closed fracture of bone of left foot 09/08/2016  . Partial epilepsy with impairment of consciousness, intractable (HCC)   . Attention deficit disorder (ADD) without hyperactivity   . Static encephalopathy   o  Past Surgical History:  Procedure Laterality Date  . ORIF ANKLE FRACTURE Left 10/18/2017   Procedure: OPEN REDUCTION INTERNAL FIXATION (ORIF) ANKLE FRACTURE;  Surgeon: Juanell FairlyKrasinski, Kevin, MD;  Location: [facility] ORS;  Service: Orthopedics;  Laterality: Left;    OB History   No obstetric history on file.      Home Medications    Prior to Admission medications   Medication Sig Start Date End Date Taking? Authorizing Provider  acetaminophen (TYLENOL) 160 MG/5ML liquid Take 20 mLs (640 mg total) by mouth every 6 (six) hours as needed for fever or  pain. Patient not taking: Reported on 10/18/2017 12/01/16   Jene EveryKinner, Robert, MD  bisacodyl (DULCOLAX) 10 MG suppository Place 1 suppository (10 mg total) rectally daily as needed for moderate constipation. 10/20/17   Lyndle HerrlichBowers, James R, MD  Calcium Carb-Cholecalciferol (CALCIUM-VITAMIN D) 500-400 MG-UNIT TABS Take 1 tablet by mouth daily. 10/20/17   Lyndle HerrlichBowers, James R, MD  carbamazepine (CARBATROL) 200 MG 12 hr capsule Take 200 mg by mouth 2 (two) times daily. (0700 & 1900)    [provider]  carbamazepine (CARBATROL) 300 MG 12 hr capsule Take 300 mg by mouth 2 (two) times daily. (0700 &1900)    [provider]  diltiazem (CARDIZEM) 30 MG tablet Take 1 tablet (30 mg total) by mouth every 6 (six) hours. Hold medication for systolic pressure less than 90 and/or diastolic pressure less than 60 Use medication if heart rate is greater than 110 10/20/17   Lyndle HerrlichBowers, James R, MD  guaifenesin (ROBITUSSIN) 100 MG/5ML syrup Take 100 mg by mouth every 4 (four) hours as needed for cough.     [provider]  HYDROcodone-acetaminophen (NORCO/VICODIN) 5-325 MG tablet Take 1 tablet by mouth every 4 (four) hours as needed for moderate pain or severe pain. 10/20/17   Lyndle HerrlichBowers, James R, MD  lactulose (CHRONULAC) 10 GM/15ML solution Take 13.3333 g by mouth every evening. (1900)Take 20 mls by mouth every evening    [provider]  lamoTRIgine (LAMICTAL) 150 MG tablet Take 300 mg by mouth 2 (two) times daily. (  0700 &1900)    [provider]  LO LOESTRIN FE 1 MG-10 MCG / 10 MCG tablet TAKE ONE TABLET BY MOUTH EVERY DAY 12/24/17   Johnson, Megan P, DO  LORazepam (ATIVAN) 1 MG tablet Take 1.5 mg by mouth every 8 (eight) hours as needed for seizure (after any cluster of seizures, or prolonged seizure,).  11/06/15   [provider]  Magnesium Oxide 200 MG TABS Take 1 tablet (200 mg total) by mouth daily at 6 (six) AM. 10/20/17   Lyndle Herrlich, MD  midazolam (VERSED) 5 MG/ML injection Place  5 mg into the nose See admin instructions. Place 1 ml (5 mg) in each nostril or on each side mouth between cheek and gum for total of 10 mg for seizure > 5 minutes 10/05/15   [provider]  Multiple Vitamin (THEREMS) TABS TAKE ONE TABLET BY MOUTH EVERY EVENING AT 1900 04/04/18   Olevia Perches P, DO  omega-3 acid ethyl esters (LOVAZA) 1 g capsule Take 1 g by mouth daily. (0700)    [provider]  risperiDONE (RISPERDAL) 1 MG tablet Take 1 mg by mouth 2 (two) times daily. (0700 &1900)    [provider]  STOOL SOFTENER 100 MG capsule TAKE ONE CAPSULE BY MOUTH EVERY MORNING Patient taking differently: TAKE ONE CAPSULE BY MOUTH EVERY MORNING AT 0700. 05/04/17   Johnson, Megan P, DO  triamcinolone cream (KENALOG) 0.1 % Apply 1 application topically 2 (two) times daily. To red, rough, itchy skin 01/25/18   Dorcas Carrow, DO    Family History History reviewed. No pertinent family history.  Social History Social History   Tobacco Use  . Smoking status: Never Smoker  . Smokeless tobacco: Never Used  Substance Use Topics  . Alcohol use: No  . Drug use: No     Allergies   Influenza vaccines   Review of Systems Review of Systems  Constitutional: Positive for activity change. Negative for appetite change, chills, fatigue and fever.  Musculoskeletal: Positive for arthralgias, gait problem and joint swelling.  All other systems reviewed and are negative.    Physical Exam Triage Vital Signs ED Triage Vitals  Enc Vitals Group     BP 09/21/18 1130 109/82     Pulse Rate 09/21/18 1130 97     Resp 09/21/18 1130 16     Temp 09/21/18 1130 97.6 F (36.4 C)     Temp Source 09/21/18 1130 Oral     SpO2 09/21/18 1130 98 %     Weight 09/21/18 1135 173 lb 8 oz (78.7 kg)     Height --      Head Circumference --      Peak Flow --      Pain Score --      Pain Loc --      Pain Edu? --      Excl. in GC? --    No data found.  Updated Vital Signs BP 109/82 (BP  Location: Right Arm)   Pulse 97   Temp 97.6 F (36.4 C) (Oral)   Resp 16   Wt 173 lb 8 oz (78.7 kg)   SpO2 98%   BMI 30.73 kg/m   Visual Acuity Right Eye Distance:   Left Eye Distance:   Bilateral Distance:    Right Eye Near:   Left Eye Near:    Bilateral Near:     Physical Exam Vitals signs and nursing note reviewed.  Constitutional:  General: She is not in acute distress.    Appearance: Normal appearance. She is not ill-appearing, toxic-appearing or diaphoretic.  HENT:     Head:     Comments: She is wearing a plastic helmet    Nose: Nose normal.     Mouth/Throat:     Mouth: Mucous membranes are moist.  Eyes:     General:        Right eye: No discharge.        Left eye: No discharge.     Conjunctiva/sclera: Conjunctivae normal.  Neck:     Musculoskeletal: Normal range of motion and neck supple.  Musculoskeletal: Normal range of motion.        General: Swelling and tenderness present.     Comments: Examination of the right knee shows no ecchymosis or erythema.  She has a 1+ effusion present.  There is swelling of the inferior knee just inferior to the lower pole of the patella to the tibial tubercle.  There is good range of motion.  Ligaments are intact and strong to testing of the medial lateral collateral ligaments as well as the anterior cruciate.  She does have tenderness to palpation of the retropatellar area.  She does have motion with the patellar grind.  Skin:    General: Skin is warm and dry.  Neurological:     Mental Status: She is alert. Mental status is at baseline.  Psychiatric:        Mood and Affect: Mood normal.        Behavior: Behavior normal.      UC Treatments / Results  Labs (all labs ordered are listed, but only abnormal results are displayed) Labs Reviewed - No data to display  EKG None  Radiology Dg Knee Complete 4 Views Right  Result Date: 09/21/2018 CLINICAL DATA:  Fall yesterday.  Right patellar pain. EXAM: RIGHT KNEE -  COMPLETE 4+ VIEW COMPARISON:  None. FINDINGS: Anterior soft tissue swelling. No fracture, dislocation, or joint effusion. IMPRESSION: Anterior soft tissue swelling.  No fracture noted. Electronically Signed   By: Gerome Sam III M.D   On: 09/21/2018 12:06    Procedures Procedures (including critical care time)  Medications Ordered in UC Medications - No data to display  Initial Impression / Assessment and Plan / UC Course  I have reviewed the triage vital signs and the nursing notes.  Pertinent labs & imaging results that were available during my care of the patient were reviewed by me and considered in my medical decision making (see chart for details).   Patient has sustained a contusion of the anterior right knee.  To assist with her ambulation I have recommended a walker for stabilization as well as a knee immobilizer to give her more stability.  She may wean both of these as tolerated.  I have recommended ice application.  She should follow-up with an orthopedic surgeon if she continues to have discomfort or problems   Final Clinical Impressions(s) / UC Diagnoses   Final diagnoses:  Effusion of right knee  Contusion of right knee, initial encounter     Discharge Instructions     Refer to physician order sheet    ED Prescriptions    None     Controlled Substance Prescriptions [place] Controlled Substance Registry consulted? Not Applicable   Lutricia Feil, PA-C 09/21/18 1320    Lutricia Feil, PA-C 09/21/18 1321

## 2018-09-21 NOTE — ED Triage Notes (Signed)
Pt from a group home with caregiver. Fell yesterday on the bus and complains of right knee pain

## 2018-10-10 DIAGNOSIS — F79 Unspecified intellectual disabilities: Secondary | ICD-10-CM | POA: Diagnosis not present

## 2018-10-10 DIAGNOSIS — N3946 Mixed incontinence: Secondary | ICD-10-CM | POA: Diagnosis not present

## 2018-10-30 ENCOUNTER — Ambulatory Visit (INDEPENDENT_AMBULATORY_CARE_PROVIDER_SITE_OTHER): Payer: BLUE CROSS/BLUE SHIELD | Admitting: Family Medicine

## 2018-10-30 ENCOUNTER — Encounter: Payer: Self-pay | Admitting: Family Medicine

## 2018-10-30 VITALS — BP 118/82 | HR 95 | Temp 98.6°F | Wt 168.6 lb

## 2018-10-30 DIAGNOSIS — Z79899 Other long term (current) drug therapy: Secondary | ICD-10-CM

## 2018-10-30 DIAGNOSIS — Z0000 Encounter for general adult medical examination without abnormal findings: Secondary | ICD-10-CM

## 2018-10-30 DIAGNOSIS — G40219 Localization-related (focal) (partial) symptomatic epilepsy and epileptic syndromes with complex partial seizures, intractable, without status epilepticus: Secondary | ICD-10-CM

## 2018-10-30 NOTE — Patient Instructions (Signed)

## 2018-10-30 NOTE — Progress Notes (Signed)
BP 118/82   Pulse 95   Temp 98.6 F (37 C) (Oral)   Wt 168 lb 9.6 oz (76.5 kg)   SpO2 98%   BMI 29.87 kg/m    Subjective:    Patient ID: Angelica Stevenson, female    DOB: [birth year], 23 y.o.   MRN: [id number]  HPI: Zuriya Zarza is a 23 y.o. female presenting on 10/30/2018 for comprehensive medical examination. Current medical complaints include: Needs FL2 form filled out. Otherwise doing well.   She currently lives with: Group Home  Depression Screen done today and results listed below:  Depression screen [facility] 2/9 10/30/2018  Decreased Interest 0  Down, Depressed, Hopeless 0  PHQ - 2 Score 0    Past Medical History:  Past Medical History:  Diagnosis Date  . Attention deficit disorder (ADD) without hyperactivity   . Bimalleolar ankle fracture, left, closed, initial encounter 10/18/2017  . Closed fracture of bone of left foot 09/08/2016  . Constipation   . Partial epilepsy with impairment of consciousness, intractable (HCC)   . Speech abnormality   . Static encephalopathy     Surgical History:  Past Surgical History:  Procedure Laterality Date  . ORIF ANKLE FRACTURE Left 10/18/2017   Procedure: OPEN REDUCTION INTERNAL FIXATION (ORIF) ANKLE FRACTURE;  Surgeon: Juanell Fairly, MD;  Location: [facility] ORS;  Service: Orthopedics;  Laterality: Left;    Medications:  Current Outpatient Medications on File Prior to Visit  Medication Sig  . acetaminophen (TYLENOL) 160 MG/5ML liquid Take 20 mLs (640 mg total) by mouth every 6 (six) hours as needed for fever or pain.  . Calcium Carb-Cholecalciferol (CALCIUM-VITAMIN D) 500-400 MG-UNIT TABS Take 1 tablet by mouth daily.  . carbamazepine (CARBATROL) 200 MG 12 hr capsule Take 200 mg by mouth 2 (two) times daily. (0700 & 1900)  . carbamazepine (CARBATROL) 300 MG 12 hr capsule Take 300 mg by mouth 2 (two) times daily. (0700 &1900)  . guaifenesin (ROBITUSSIN) 100 MG/5ML syrup Take 100 mg by mouth every 4 (four) hours as needed for cough.     . lactulose (CHRONULAC) 10 GM/15ML solution Take 13.3333 g by mouth every evening. (1900)Take 20 mls by mouth every evening  . lamoTRIgine (LAMICTAL) 150 MG tablet Take 300 mg by mouth 2 (two) times daily. (0700 &1900)  . LO LOESTRIN FE 1 MG-10 MCG / 10 MCG tablet TAKE ONE TABLET BY MOUTH EVERY DAY  . LORazepam (ATIVAN) 1 MG tablet Take 1.5 mg by mouth every 8 (eight) hours as needed for seizure (after any cluster of seizures, or prolonged seizure,).   . Magnesium Oxide 200 MG TABS Take 1 tablet (200 mg total) by mouth daily at 6 (six) AM.  . midazolam (VERSED) 5 MG/ML injection Place 5 mg into the nose See admin instructions. Place 1 ml (5 mg) in each nostril or on each side mouth between cheek and gum for total of 10 mg for seizure > 5 minutes  . Multiple Vitamin (THEREMS) TABS TAKE ONE TABLET BY MOUTH EVERY EVENING AT 1900  . omega-3 acid ethyl esters (LOVAZA) 1 g capsule Take 1 g by mouth daily. (0700)  . risperiDONE (RISPERDAL) 1 MG tablet Take 1 mg by mouth 2 (two) times daily. (0700 &1900)  . STOOL SOFTENER 100 MG capsule TAKE ONE CAPSULE BY MOUTH EVERY MORNING (Patient taking differently: TAKE ONE CAPSULE BY MOUTH EVERY MORNING AT 0700.)  . triamcinolone cream (KENALOG) 0.1 % Apply 1 application topically 2 (two) times daily. To red, rough, itchy skin  No current facility-administered medications on file prior to visit.     Allergies:  Allergies  Allergen Reactions  . Influenza Vaccines Other (See Comments)    seizures    Social History:  Social History   Socioeconomic History  . Marital status: Single    Spouse name: Not on file  . Number of children: Not on file  . Years of education: Not on file  . Highest education level: Not on file  Occupational History  . Not on file  Social Needs  . Financial resource strain: Not on file  . Food insecurity:    Worry: Not on file    Inability: Not on file  . Transportation needs:    Medical: Not on file    Non-medical: Not on  file  Tobacco Use  . Smoking status: Never Smoker  . Smokeless tobacco: Never Used  Substance and Sexual Activity  . Alcohol use: No  . Drug use: No  . Sexual activity: Not on file  Lifestyle  . Physical activity:    Days per week: Not on file    Minutes per session: Not on file  . Stress: Not on file  Relationships  . Social connections:    Talks on phone: Not on file    Gets together: Not on file    Attends religious service: Not on file    Active member of club or organization: Not on file    Attends meetings of clubs or organizations: Not on file    Relationship status: Not on file  . Intimate partner violence:    Fear of current or ex partner: Not on file    Emotionally abused: Not on file    Physically abused: Not on file    Forced sexual activity: Not on file  Other Topics Concern  . Not on file  Social History Narrative  . Not on file   Social History   Tobacco Use  Smoking Status Never Smoker  Smokeless Tobacco Never Used   Social History   Substance and Sexual Activity  Alcohol Use No    Family History:  History reviewed. No pertinent family history.  Past medical history, surgical history, medications, allergies, family history and social history reviewed with patient today and changes made to appropriate areas of the chart.   Review of Systems  Constitutional: Negative.   HENT: Negative.   Eyes: Negative.   Respiratory: Negative.   Cardiovascular: Negative.   Gastrointestinal: Negative.   Genitourinary: Negative.   Musculoskeletal: Negative.   Skin: Negative.   Neurological: Negative.   Endo/Heme/Allergies: Negative.   Psychiatric/Behavioral: Negative.     All other ROS negative except what is listed above and in the HPI.      Objective:    BP 118/82   Pulse 95   Temp 98.6 F (37 C) (Oral)   Wt 168 lb 9.6 oz (76.5 kg)   SpO2 98%   BMI 29.87 kg/m   Wt Readings from Last 3 Encounters:  10/30/18 168 lb 9.6 oz (76.5 kg)  09/21/18  173 lb 8 oz (78.7 kg)  01/25/18 170 lb (77.1 kg)    Physical Exam Vitals signs and nursing note reviewed.  Constitutional:      General: She is not in acute distress.    Appearance: Normal appearance. She is not ill-appearing, toxic-appearing or diaphoretic.  HENT:     Head: Normocephalic and atraumatic.     Right Ear: Tympanic membrane, ear canal and external ear normal. There  is no impacted cerumen.     Left Ear: Tympanic membrane, ear canal and external ear normal. There is no impacted cerumen.     Nose: Nose normal. No congestion or rhinorrhea.     Mouth/Throat:     Mouth: Mucous membranes are moist.     Pharynx: Oropharynx is clear. No oropharyngeal exudate or posterior oropharyngeal erythema.  Eyes:     General: No scleral icterus.       Right eye: No discharge.        Left eye: No discharge.     Extraocular Movements: Extraocular movements intact.     Conjunctiva/sclera: Conjunctivae normal.     Pupils: Pupils are equal, round, and reactive to light.  Neck:     Musculoskeletal: Normal range of motion and neck supple. No neck rigidity or muscular tenderness.     Vascular: No carotid bruit.  Cardiovascular:     Rate and Rhythm: Normal rate and regular rhythm.     Pulses: Normal pulses.     Heart sounds: No murmur. No friction rub. No gallop.   Pulmonary:     Effort: Pulmonary effort is normal. No respiratory distress.     Breath sounds: Normal breath sounds. No stridor. No wheezing, rhonchi or rales.  Chest:     Chest wall: No tenderness.  Abdominal:     General: Abdomen is flat. Bowel sounds are normal. There is no distension.     Palpations: Abdomen is soft. There is no mass.     Tenderness: There is no abdominal tenderness. There is no right CVA tenderness, left CVA tenderness, guarding or rebound.     Hernia: No hernia is present.  Genitourinary:    Comments: Breast and pelvic exams deferred with shared decision making Musculoskeletal:        General: No swelling,  tenderness, deformity or signs of injury.     Right lower leg: No edema.     Left lower leg: No edema.  Lymphadenopathy:     Cervical: No cervical adenopathy.  Skin:    General: Skin is warm and dry.     Capillary Refill: Capillary refill takes less than 2 seconds.     Coloration: Skin is not jaundiced or pale.     Findings: No bruising, erythema, lesion or rash.  Neurological:     General: No focal deficit present.     Mental Status: She is alert and oriented to person, place, and time. Mental status is at baseline.     Cranial Nerves: No cranial nerve deficit.     Sensory: No sensory deficit.     Motor: No weakness.     Coordination: Coordination normal.     Gait: Gait normal.     Deep Tendon Reflexes: Reflexes normal.  Psychiatric:        Mood and Affect: Mood normal.        Behavior: Behavior normal.        Thought Content: Thought content normal.        Judgment: Judgment normal.     Results for orders placed or performed during the hospital encounter of 10/18/17  CBC  Result Value Ref Range   WBC 10.1 3.6 - 11.0 K/uL   RBC 3.93 3.80 - 5.20 MIL/uL   Hemoglobin 12.1 12.0 - 16.0 g/dL   HCT 88.8 91.6 - 94.5 %   MCV 89.7 80.0 - 100.0 fL   MCH 30.9 26.0 - 34.0 pg   MCHC 34.5 32.0 - 36.0 g/dL  RDW 12.1 11.5 - 14.5 %   Platelets 282 150 - 440 K/uL  Basic metabolic panel  Result Value Ref Range   Sodium 139 135 - 145 mmol/L   Potassium 3.6 3.5 - 5.1 mmol/L   Chloride 105 101 - 111 mmol/L   CO2 26 22 - 32 mmol/L   Glucose, Bld 124 (H) 65 - 99 mg/dL   BUN 6 6 - 20 mg/dL   Creatinine, Ser 3.660.61 0.44 - 1.00 mg/dL   Calcium 8.7 (L) 8.9 - 10.3 mg/dL   GFR calc non Af Amer >60 >60 mL/min   GFR calc Af Amer >60 >60 mL/min   Anion gap 8 5 - 15  CBC  Result Value Ref Range   WBC 11.8 (H) 3.6 - 11.0 K/uL   RBC 3.86 3.80 - 5.20 MIL/uL   Hemoglobin 11.8 (L) 12.0 - 16.0 g/dL   HCT 44.035.1 34.735.0 - 42.547.0 %   MCV 91.0 80.0 - 100.0 fL   MCH 30.6 26.0 - 34.0 pg   MCHC 33.6 32.0 -  36.0 g/dL   RDW 95.612.3 38.711.5 - 56.414.5 %   Platelets 290 150 - 440 K/uL  Basic metabolic panel  Result Value Ref Range   Sodium 139 135 - 145 mmol/L   Potassium 3.6 3.5 - 5.1 mmol/L   Chloride 107 101 - 111 mmol/L   CO2 24 22 - 32 mmol/L   Glucose, Bld 106 (H) 65 - 99 mg/dL   BUN 7 6 - 20 mg/dL   Creatinine, Ser 3.320.65 0.44 - 1.00 mg/dL   Calcium 8.6 (L) 8.9 - 10.3 mg/dL   GFR calc non Af Amer >60 >60 mL/min   GFR calc Af Amer >60 >60 mL/min   Anion gap 8 5 - 15  Pregnancy, urine POC  Result Value Ref Range   Preg Test, Ur NEGATIVE NEGATIVE      Assessment & Plan:   Problem List Items Addressed This Visit      Nervous and Auditory   Partial epilepsy with impairment of consciousness, intractable (HCC)    Stable. Continues to follow with neurology. Checking labs today. Await results. Call with any concerns.        Other Visit Diagnoses    Routine general medical examination at a health care facility    -  Primary   Vaccines up to date. Screening labs checked today. Continue diet and exercise. Call with any concerns.    Relevant Orders   CBC with Differential/Platelet   Bayer DCA Hb A1c Waived   Comprehensive metabolic panel   HIV Antibody (routine testing w rflx)   Lipid Panel w/o Chol/HDL Ratio   TSH   UA/M w/rflx Culture, Routine   VITAMIN D 25 Hydroxy (Vit-D Deficiency, Fractures)   Long-term use of high-risk medication       Checking labs today. Awiat results. Continue to follow with neurology.   Relevant Orders   Carbamazepine Level (Tegretol), total   Lamotrigine level       Follow up plan: Return in about 6 months (around 04/30/2019) for Follow up.   LABORATORY TESTING:  - Pap smear: Declined today  IMMUNIZATIONS:   - Tdap: Tetanus vaccination status reviewed: last tetanus booster within 10 years. - Influenza: Allergic. N/A - Pneumovax: Not applicable - Prevnar: Up to date   PATIENT COUNSELING:   Advised to take 1 mg of folate supplement per day if  capable of pregnancy.   Sexuality: Discussed sexually transmitted diseases, partner selection, use of  condoms, avoidance of unintended pregnancy  and contraceptive alternatives.   Advised to avoid cigarette smoking.  I discussed with the patient that most people either abstain from alcohol or drink within safe limits (<=14/week and <=4 drinks/occasion for males, <=7/weeks and <= 3 drinks/occasion for females) and that the risk for alcohol disorders and other health effects rises proportionally with the number of drinks per week and how often a drinker exceeds daily limits.  Discussed cessation/primary prevention of drug use and availability of treatment for abuse.   Diet: Encouraged to adjust caloric intake to maintain  or achieve ideal body weight, to reduce intake of dietary saturated fat and total fat, to limit sodium intake by avoiding high sodium foods and not adding table salt, and to maintain adequate dietary potassium and calcium preferably from fresh fruits, vegetables, and low-fat dairy products.    stressed the importance of regular exercise  Injury prevention: Discussed safety belts, safety helmets, smoke detector, smoking near bedding or upholstery.   Dental health: Discussed importance of regular tooth brushing, flossing, and dental visits.    NEXT PREVENTATIVE PHYSICAL DUE IN 1 YEAR. Return in about 6 months (around 04/30/2019) for Follow up.

## 2018-10-30 NOTE — Assessment & Plan Note (Signed)
Stable. Continues to follow with neurology. Checking labs today. Await results. Call with any concerns.

## 2018-11-04 ENCOUNTER — Other Ambulatory Visit: Payer: BLUE CROSS/BLUE SHIELD

## 2018-11-04 DIAGNOSIS — Z0000 Encounter for general adult medical examination without abnormal findings: Secondary | ICD-10-CM | POA: Diagnosis not present

## 2018-11-04 LAB — BAYER DCA HB A1C WAIVED: HB A1C: 5 % (ref <7.0)

## 2018-11-05 ENCOUNTER — Other Ambulatory Visit: Payer: Self-pay | Admitting: Family Medicine

## 2018-11-05 ENCOUNTER — Other Ambulatory Visit: Payer: BLUE CROSS/BLUE SHIELD

## 2018-11-06 ENCOUNTER — Other Ambulatory Visit: Payer: BLUE CROSS/BLUE SHIELD

## 2018-11-06 LAB — UA/M W/RFLX CULTURE, ROUTINE
Bilirubin, UA: NEGATIVE
Glucose, UA: NEGATIVE
KETONES UA: NEGATIVE
Leukocytes, UA: NEGATIVE
Nitrite, UA: NEGATIVE
Protein, UA: NEGATIVE
RBC, UA: NEGATIVE
Specific Gravity, UA: 1.01 (ref 1.005–1.030)
Urobilinogen, Ur: 0.2 mg/dL (ref 0.2–1.0)
pH, UA: 7 (ref 5.0–7.5)

## 2018-11-07 LAB — CBC WITH DIFFERENTIAL/PLATELET
BASOS: 0 %
Basophils Absolute: 0 10*3/uL (ref 0.0–0.2)
EOS (ABSOLUTE): 0.1 10*3/uL (ref 0.0–0.4)
Eos: 1 %
Hematocrit: 38.6 % (ref 34.0–46.6)
Hemoglobin: 13.1 g/dL (ref 11.1–15.9)
Immature Grans (Abs): 0 10*3/uL (ref 0.0–0.1)
Immature Granulocytes: 0 %
LYMPHS ABS: 2.9 10*3/uL (ref 0.7–3.1)
Lymphs: 35 %
MCH: 31 pg (ref 26.6–33.0)
MCHC: 33.9 g/dL (ref 31.5–35.7)
MCV: 91 fL (ref 79–97)
Monocytes Absolute: 0.3 10*3/uL (ref 0.1–0.9)
Monocytes: 4 %
Neutrophils Absolute: 4.8 10*3/uL (ref 1.4–7.0)
Neutrophils: 60 %
Platelets: 362 10*3/uL (ref 150–450)
RBC: 4.23 x10E6/uL (ref 3.77–5.28)
RDW: 11.7 % (ref 11.7–15.4)
WBC: 8.1 10*3/uL (ref 3.4–10.8)

## 2018-11-07 LAB — COMPREHENSIVE METABOLIC PANEL
ALT: 14 IU/L (ref 0–32)
AST: 12 IU/L (ref 0–40)
Albumin/Globulin Ratio: 2 (ref 1.2–2.2)
Albumin: 4.7 g/dL (ref 3.9–5.0)
Alkaline Phosphatase: 137 IU/L — ABNORMAL HIGH (ref 39–117)
BUN/Creatinine Ratio: 6 — ABNORMAL LOW (ref 9–23)
BUN: 5 mg/dL — AB (ref 6–20)
CHLORIDE: 98 mmol/L (ref 96–106)
CO2: 24 mmol/L (ref 20–29)
Calcium: 10 mg/dL (ref 8.7–10.2)
Creatinine, Ser: 0.84 mg/dL (ref 0.57–1.00)
GFR calc Af Amer: 115 mL/min/{1.73_m2} (ref >59)
GFR calc non Af Amer: 100 mL/min/{1.73_m2} (ref >59)
Globulin, Total: 2.4 g/dL (ref 1.5–4.5)
Glucose: 135 mg/dL — ABNORMAL HIGH (ref 65–99)
Potassium: 4.1 mmol/L (ref 3.5–5.2)
Sodium: 138 mmol/L (ref 134–144)
Total Protein: 7.1 g/dL (ref 6.0–8.5)

## 2018-11-07 LAB — LIPID PANEL W/O CHOL/HDL RATIO
CHOLESTEROL TOTAL: 197 mg/dL (ref 100–199)
HDL: 52 mg/dL (ref >39)
LDL Calculated: 106 mg/dL — ABNORMAL HIGH (ref 0–99)
Triglycerides: 193 mg/dL — ABNORMAL HIGH (ref 0–149)
VLDL Cholesterol Cal: 39 mg/dL (ref 5–40)

## 2018-11-07 LAB — HIV ANTIBODY (ROUTINE TESTING W REFLEX): HIV Screen 4th Generation wRfx: NONREACTIVE

## 2018-11-07 LAB — LAMOTRIGINE LEVEL: Lamotrigine Lvl: 9.2 ug/mL (ref 2.0–20.0)

## 2018-11-07 LAB — TSH: TSH: 1.61 u[IU]/mL (ref 0.450–4.500)

## 2018-11-07 LAB — VITAMIN D 25 HYDROXY (VIT D DEFICIENCY, FRACTURES): VIT D 25 HYDROXY: 30 ng/mL (ref 30.0–100.0)

## 2018-11-07 LAB — CARBAMAZEPINE LEVEL, TOTAL: Carbamazepine (Tegretol), S: 10.9 ug/mL (ref 4.0–12.0)

## 2018-11-08 ENCOUNTER — Encounter: Payer: Self-pay | Admitting: Family Medicine

## 2018-11-11 DIAGNOSIS — N3946 Mixed incontinence: Secondary | ICD-10-CM | POA: Diagnosis not present

## 2018-11-11 DIAGNOSIS — F79 Unspecified intellectual disabilities: Secondary | ICD-10-CM | POA: Diagnosis not present

## 2018-12-09 DIAGNOSIS — F79 Unspecified intellectual disabilities: Secondary | ICD-10-CM | POA: Diagnosis not present

## 2018-12-09 DIAGNOSIS — N3946 Mixed incontinence: Secondary | ICD-10-CM | POA: Diagnosis not present

## 2019-01-09 DIAGNOSIS — N3946 Mixed incontinence: Secondary | ICD-10-CM | POA: Diagnosis not present

## 2019-01-09 DIAGNOSIS — F79 Unspecified intellectual disabilities: Secondary | ICD-10-CM | POA: Diagnosis not present

## 2019-02-07 DEATH — deceased
